# Patient Record
Sex: Male | Born: 1945 | Race: White | Hispanic: No | Marital: Single | Smoking: Former smoker
Health system: Southern US, Community
[De-identification: ages and names within clinical notes are randomized; demographics above are authoritative.]

## PROBLEM LIST (undated history)

## (undated) DIAGNOSIS — I10 Essential (primary) hypertension: Secondary | ICD-10-CM

## (undated) DIAGNOSIS — E119 Type 2 diabetes mellitus without complications: Secondary | ICD-10-CM

## (undated) DIAGNOSIS — K219 Gastro-esophageal reflux disease without esophagitis: Secondary | ICD-10-CM

## (undated) DIAGNOSIS — F1021 Alcohol dependence, in remission: Secondary | ICD-10-CM

## (undated) DIAGNOSIS — M6281 Muscle weakness (generalized): Secondary | ICD-10-CM

## (undated) DIAGNOSIS — N183 Chronic kidney disease, stage 3 unspecified: Secondary | ICD-10-CM

## (undated) DIAGNOSIS — Z978 Presence of other specified devices: Secondary | ICD-10-CM

## (undated) DIAGNOSIS — R627 Adult failure to thrive: Secondary | ICD-10-CM

## (undated) DIAGNOSIS — Z87828 Personal history of other (healed) physical injury and trauma: Secondary | ICD-10-CM

## (undated) DIAGNOSIS — M25331 Other instability, right wrist: Secondary | ICD-10-CM

## (undated) DIAGNOSIS — H9191 Unspecified hearing loss, right ear: Secondary | ICD-10-CM

## (undated) DIAGNOSIS — C679 Malignant neoplasm of bladder, unspecified: Secondary | ICD-10-CM

## (undated) DIAGNOSIS — R2689 Other abnormalities of gait and mobility: Secondary | ICD-10-CM

## (undated) DIAGNOSIS — G629 Polyneuropathy, unspecified: Secondary | ICD-10-CM

## (undated) DIAGNOSIS — D649 Anemia, unspecified: Secondary | ICD-10-CM

## (undated) DIAGNOSIS — N139 Obstructive and reflux uropathy, unspecified: Secondary | ICD-10-CM

## (undated) HISTORY — PX: HAND SURGERY: SHX662

## (undated) HISTORY — PX: LAPAROSCOPIC CHOLECYSTECTOMY: SUR755

---

## 1994-01-24 HISTORY — PX: FRACTURE SURGERY: SHX138

## 2002-10-05 ENCOUNTER — Inpatient Hospital Stay (HOSPITAL_COMMUNITY): Admission: EM | Admit: 2002-10-05 | Discharge: 2002-10-08 | Payer: Self-pay | Admitting: Emergency Medicine

## 2002-10-05 ENCOUNTER — Encounter: Payer: Self-pay | Admitting: Emergency Medicine

## 2002-10-07 ENCOUNTER — Encounter (INDEPENDENT_AMBULATORY_CARE_PROVIDER_SITE_OTHER): Payer: Self-pay | Admitting: Specialist

## 2009-06-05 ENCOUNTER — Ambulatory Visit: Payer: Self-pay | Admitting: Oncology

## 2009-06-08 LAB — CBC & DIFF AND RETIC
Basophils Absolute: 0 10*3/uL (ref 0.0–0.1)
EOS%: 0.2 % (ref 0.0–7.0)
HCT: 54.8 % — ABNORMAL HIGH (ref 38.4–49.9)
HGB: 19.5 g/dL — ABNORMAL HIGH (ref 13.0–17.1)
LYMPH%: 16.4 % (ref 14.0–49.0)
MCH: 33.2 pg (ref 27.2–33.4)
MCHC: 35.6 g/dL (ref 32.0–36.0)
MCV: 93.4 fL (ref 79.3–98.0)
MONO#: 0.9 10*3/uL (ref 0.1–0.9)
NEUT#: 8.1 10*3/uL — ABNORMAL HIGH (ref 1.5–6.5)
lymph#: 1.8 10*3/uL (ref 0.9–3.3)

## 2009-06-08 LAB — COMPREHENSIVE METABOLIC PANEL
Alkaline Phosphatase: 88 U/L (ref 39–117)
BUN: 11 mg/dL (ref 6–23)
CO2: 23 mEq/L (ref 19–32)
Calcium: 9.4 mg/dL (ref 8.4–10.5)
Chloride: 101 mEq/L (ref 96–112)
Creatinine, Ser: 0.91 mg/dL (ref 0.40–1.50)
Glucose, Bld: 193 mg/dL — ABNORMAL HIGH (ref 70–99)
Total Bilirubin: 1.5 mg/dL — ABNORMAL HIGH (ref 0.3–1.2)

## 2009-06-08 LAB — MORPHOLOGY

## 2009-06-08 LAB — CHCC SMEAR

## 2009-06-09 LAB — IRON AND TIBC
%SAT: 27 % (ref 20–55)
Iron: 107 ug/dL (ref 42–165)
TIBC: 394 ug/dL (ref 215–435)

## 2009-06-09 LAB — ERYTHROPOIETIN: Erythropoietin: 3.2 m[IU]/mL (ref 2.6–34.0)

## 2009-06-09 LAB — FERRITIN: Ferritin: 166 ng/mL (ref 22–322)

## 2009-06-17 ENCOUNTER — Ambulatory Visit (HOSPITAL_BASED_OUTPATIENT_CLINIC_OR_DEPARTMENT_OTHER): Admission: RE | Admit: 2009-06-17 | Discharge: 2009-06-17 | Payer: Self-pay | Admitting: Orthopedic Surgery

## 2009-06-26 ENCOUNTER — Ambulatory Visit: Admission: RE | Admit: 2009-06-26 | Discharge: 2009-06-26 | Payer: Self-pay | Admitting: Oncology

## 2009-10-06 ENCOUNTER — Ambulatory Visit: Payer: Self-pay | Admitting: Oncology

## 2010-04-12 LAB — POCT I-STAT, CHEM 8
BUN: 13 mg/dL (ref 6–23)
Calcium, Ion: 1.09 mmol/L — ABNORMAL LOW (ref 1.12–1.32)
Chloride: 95 mEq/L — ABNORMAL LOW (ref 96–112)
Creatinine, Ser: 0.8 mg/dL (ref 0.4–1.5)
Glucose, Bld: 207 mg/dL — ABNORMAL HIGH (ref 70–99)
HCT: 64 % — ABNORMAL HIGH (ref 39.0–52.0)
Hemoglobin: 21.8 g/dL (ref 13.0–17.0)
Potassium: 3.5 mEq/L (ref 3.5–5.1)
Sodium: 133 mEq/L — ABNORMAL LOW (ref 135–145)
TCO2: 29 mmol/L (ref 0–100)

## 2010-04-12 LAB — CARBOXYHEMOGLOBIN
O2 Saturation: 98.2 %
Total hemoglobin: 18.3 g/dL — ABNORMAL HIGH (ref 13.5–18.0)

## 2010-05-06 NOTE — Op Note (Signed)
  NAMEZACARIAS, KRAUTER                  ACCOUNT NO.:  [id number]  MEDICAL RECORD NO.:  [id number]           PATIENT TYPE:  LOCATION:                                 FACILITY:  PHYSICIAN:  Artist Pais. Mina Marble, M.D.   DATE OF BIRTH:  DATE OF PROCEDURE:  04/18/2010 DATE OF DISCHARGE:                              OPERATIVE REPORT   PREOPERATIVE DIAGNOSIS:  Right hand dorsal laceration with exposed tendon soft tissue loss.  POSTOPERATIVE DIAGNOSIS:  Right hand dorsal laceration with exposed tendon soft tissue loss.  PROCEDURE:  I and D above with extensor tendon repair x2 and primary wound closure.  SURGEON:  Artist Pais. Mina Marble, M.D.  ASSISTANT:  None.  ANESTHESIA:  General.  TOURNIQUET TIME:  33 minutes.  No complication.  No Drains.  The patient was taken to the operating suite after the induction of adequate general anesthesia.  The right upper extremity was prepped and draped in sterile fashion.  An Esmarch was used exsanguinate the limb. Tourniquet was then inflated to 250 mmHg at this point in time.  A dorsal wound on the right hand was explored.  There was lacerations to the long and ring extensor tendons.  These were debrided of clot, repaired with Ethibond suture 2-0.  There was also some irregularity of the wound edges, it was carefully advanced and loosely closed with 4-0 nylon suture.  The patient was placed in a sterile dressing of Xeroform, 4x4s, fluffs, and a volar splint.  The patient tolerated the procedure, went to recovery room in stable fashion.     Artist Pais Mina Marble, M.D.     MAW/MEDQ  D:  04/21/2010  T:  04/22/2010  Job:  [id number]  Electronically Signed by Dairl Ponder M.D. on 05/06/2010 03:03:14 PM

## 2010-06-11 NOTE — Op Note (Signed)
   [name], [name] NO.:  [id number]   MEDICAL RECORD NO.:  [id number]                   PATIENT TYPE:  INP   LOCATION:  5734                                 FACILITY:  [facility]   PHYSICIAN:  Lina Sar, M.D. [facility]               DATE OF BIRTH:  [birth year]   DATE OF PROCEDURE:  DATE OF DISCHARGE:  10/08/2002                                 OPERATIVE REPORT   PROCEDURE:  ERCP   PHYSICIAN:  Lina Sar, M.D. [facility]   INDICATION:  This 65 year old white male was admitted with recurrent acute  upper abdominal pain which started on the night of admission.  He had been  drinking a lot of alcohol chronically.  He was found to have cholelithiasis  on gallbladder ultrasound as well as splenomegaly, gallbladder wall was  thickened.  He was treated for cholangitis with Unasyn intravenously and is  undergoing ERCP to rule out common bile duct stone.   PROCEDURE PERFORMED:  Endoscope for general and single chamber scope   SEDATION:  Versed 9 mg IV, Fentanyl 19 mcg IV, Glucagon 0.5 mg IV.   FINDINGS:  The scope was passed through the esophagus into the stomach and  through the pyloric channel into the duodenum.  Papilla was visualized  without difficulty and showed a normal appearance.  The common bile duct  filled as well as part of the main pancreatic duct.  The main pancreatic  duct was intentionally avoided.  The common bile duct was visualized and  shows a 3 mm diameter common bile duct with filling of all hepatic ducts  into hepatic radicals which appears normal.  There were no stones in the  common bile duct and the common bile duct filled rapidly.  Cystic duct was  patent, part of the gallbladder filled showing multiple gall stones.  A  standard sphincterotomy was carried out with sphincterotome at the papilla  with no bleeding.  Fluoroscopy was provided.  The patient tolerated the  procedure well.   IMPRESSION:  1. Cholelithiasis.  2. Normal common  bile duct.  3. Status post sphincterotomy.   PLAN:  1. Observation.  2. Laparoscopic cholecystectomy.  3. Repeat liver function tests.  4. Suggest intraoperative liver biopsy to rule out alcohol related liver     disease.  5. Watch for alcohol withdrawal.  6. Amylase, lipase.                                               Lina Sar, M.D. [facility]    DB/MEDQ  D:  11/28/2002  T:  11/29/2002  Job:  [id number]
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
## 2010-06-11 NOTE — Op Note (Signed)
NAMEMARKEITH, [name] NO.:  [id number]   MEDICAL RECORD NO.:  [id number]                   PATIENT TYPE:  INP   LOCATION:  5734                                 FACILITY:  [facility]   PHYSICIAN:  Lina Sar, M.D. [facility]               DATE OF BIRTH:  [birth year]   DATE OF PROCEDURE:  10/06/2002  DATE OF DISCHARGE:                                 OPERATIVE REPORT   PROCEDURE:  Endoscopic retrograde cholangiopancreatography.   HISTORY OF PRESENT ILLNESS:  This 65 year old white male was admitted with  severe right upper quadrant abdominal pain with bilirubin 4.4 and tenderness  in the right upper quadrant.  The pain subsided after pain medications.  He  gives history of heavy alcohol use.  Ultrasound sound of the upper abdomen  showed cholelithiasis but normal  common bile duct.  His bilirubin today was 9 with direct and indirect  bilirubin split equally.  He has had previous attacks of abdominal pain  suggestive biliary colic.  He has undergoing ERCP to rule out common bile  duct stone.   ENDOSCOPE:  Olympus single-channel side-viewing duodenoscope.   CONSCIOUS SEDATION:  Versed 9 mg IV, fentanyl 90 mcg IV and glucagon 0.5 mg  IV.   FINDINGS:  Olympus single-channel video endoscope passed beneath the  posterior pharynx into the esophagus, through the stomach and into the  duodenum.  Papilla was visualized immediately and showed normal size.  It  was cannulated without difficulty and selectively only common bile duct was  filled.  I have avoided intentionally pancreatic duct because the patient's  amylase has been elevated.  Only a slight amount of dye visualized the  pancreatic duct and the head of the pancreas.  Bile duct filled immediately  and showed very small size, being at most 3 mm in diameter.  It was smooth,  tapered distally.  The flow insertion cystic duct which was patent.  Gallbladder was visualized with multiple stones.  Common hepatic duct  and  intrahepatic radicals appeared normal.  There was no evidence of stones.  Sphincterotomy was carried out over a guide wire under fluoroscopic  guidance.  Some bile exuded but no evidence of stones.  Because of the small  size of the common bile duct, balloon was not placed in the common bile duct  for a sweep.  Fluoroscopic images were reviewed and since no stones were  seen, the procedure was terminated.   The patient tolerated the procedure well.   IMPRESSION:  1. Cholelithiasis.  2. Normal common bile duct without evidence of retained stones.  3. Status post standard sphincterotomy.   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  The patient also has cholelithiasis and  cholecystitis and is scheduled for laparoscopic cholecystectomy and liver  biopsy for  tomorrow, October 07, 2002.  We will  follow his amylase and lipase, keep  him on bowel rest and watch for alcohol withdrawal.  He will have liver  function tests repeated in the morning.  The patient is to continue on  Unasyn 1.5 g IV.                                               Lina Sar, M.D. [facility]    DB/MEDQ  D:  10/06/2002  T:  10/06/2002  Job:  [id number]   cc:   Vikki Ports, M.D.  [address]  [place]  [place] [address]  Fax: [phone]
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
## 2010-06-11 NOTE — Op Note (Signed)
   NAMESADIEL, MOTA NO.:  [id number]   MEDICAL RECORD NO.:  [id number]                   PATIENT TYPE:  INP   LOCATION:  5734                                 FACILITY:  [facility]   PHYSICIAN:  Vikki Ports, M.D.         DATE OF BIRTH:  1945/01/29   DATE OF PROCEDURE:  10/07/2002  DATE OF DISCHARGE:                                 OPERATIVE REPORT   PREOPERATIVE DIAGNOSES:  Elevated liver function tests, normal endoscopic  retrograde cholangiopancreatography and acute cholecystitis.   POSTOPERATIVE DIAGNOSES:  Elevated liver function tests, normal endoscopic  retrograde cholangiopancreatography and acute cholecystitis.   OPERATION PERFORMED:  Laparoscopic cholecystectomy.   SURGEON:  Vikki Ports, M.D.   ASSISTANT:  Adolph Pollack, M.D.   ANESTHESIA:  General.   DESCRIPTION OF PROCEDURE:  The patient was taken to the operating room and  placed in supine position.  After adequate  anesthesia was induced, the  abdomen was prepped and draped in the normal sterile fashion.  Using a  transverse infraumbilical incision, I dissected down to the fascia.  The  fascia was opened vertically.  A 0 Vicryl pursestring suture was placed  around the fascial defect.  The Hasson trocar was placed in the abdomen and  the abdomen was insufflated with continuous flow carbon dioxide.  Under  direct visualization, a 10 mm port was placed in the subxiphoid region, two  5 mm ports were placed in the right abdomen.  The gallbladder was  identified, was very edematous, had a significant amount of adhesions.  These were taken down.  The gallbladder was very, very thick-walled and  ischemic appearing.  The neck of the gallbladder was retracted laterally.  A  good window was created around the cystic duct and it was triply clipped and  divided. The cystic artery was also well-visualized, triply clipped and  divided.  The gallbladder virtually had no  mesentery of the liver and it  peeled off the liver causing significant bleeding from the gallbladder bed.  This was controlled using electrocautery, Surgicel and packing.  After  adequate hemostasis was ensured, the gallbladder was placed in an EndoCatch  bag and removed from the umbilical port.  Adequate hemostasis was ensured.  Infraumbilical fascial defect was closed with the 0 Vicryl pursestring  suture.  Skin incisions were closed with staples.  Incisions were injected  using Marcaine.  The patient tolerated the procedure well and went to PACU  in good condition.                                                Vikki Ports, M.D.    KRH/MEDQ  D:  10/07/2002  T:  10/07/2002  Job:  [id number]

## 2010-06-11 NOTE — Discharge Summary (Signed)
[name], [name]                             ACCOUNT NO.:  [id number]   MEDICAL RECORD NO.:  [id number]                   PATIENT TYPE:  INP   LOCATION:  5734                                 FACILITY:  [facility]   PHYSICIAN:  Vikki Ports, M.D.         DATE OF BIRTH:  1946/01/15   DATE OF ADMISSION:  10/05/2002  DATE OF DISCHARGE:  10/08/2002                                 DISCHARGE SUMMARY   ADMISSION DIAGNOSIS:  Choledocholithiasis.   DISCHARGE DIAGNOSIS:  Choledocholithiasis.   CONDITION ON DISCHARGE:  Good and improved.   CONSULTING PHYSICIAN:  Dr. Lina Sar, M.D. [facility]   DISPOSITION:  Discharged to home.   MEDICATIONS:  Vicodin one to two p.o. q.4h. p.r.n. pain.   FOLLOWUP:  With me one week after discharge.   BRIEF HISTORY AND PHYSICAL:  The patient is a 65 year old white male who  presented to the emergency room with abdominal pain, fever, increased liver  function tests and a bilirubin of 4.7.  An ultrasound was consistent with  stones in the gallbladder and probable choledocholithiasis.  For the  remainder of the H&P, please see the chart.   HOSPITAL COURSE:  The patient was admitted by gastroenterology and underwent  ERCP.  The patient had a very small common bile duct and had a  sphincterotomy performed, but no stones were seen.  On the second hospital  day, the patient was taken to the operating room and underwent laparoscopic  cholecystectomy and liver biopsy.  Postoperatively, the patient did quite  well and was able to tolerated p.o. intake by postoperative day number 1.   FOLLOWUP:  1. With me.  2. With gastroenterologist pending hepatitis serologies.                                                Vikki Ports, M.D.    KRH/MEDQ  D:  02/12/2003  T:  02/12/2003  Job:  [id number]

## 2010-08-02 ENCOUNTER — Other Ambulatory Visit: Payer: Self-pay | Admitting: Neurology

## 2010-08-02 DIAGNOSIS — H532 Diplopia: Secondary | ICD-10-CM

## 2010-08-02 DIAGNOSIS — R269 Unspecified abnormalities of gait and mobility: Secondary | ICD-10-CM

## 2010-08-06 ENCOUNTER — Inpatient Hospital Stay: Admission: RE | Admit: 2010-08-06 | Payer: Self-pay | Source: Ambulatory Visit

## 2010-08-06 ENCOUNTER — Other Ambulatory Visit: Payer: Self-pay

## 2013-10-11 ENCOUNTER — Other Ambulatory Visit: Payer: Self-pay | Admitting: *Deleted

## 2013-10-11 NOTE — Telephone Encounter (Signed)
Received fax to refill HCTZ- sent fax to pharmacy pt is not seen in our clinic.

## 2015-05-30 ENCOUNTER — Emergency Department (HOSPITAL_COMMUNITY)
Admission: EM | Admit: 2015-05-30 | Discharge: 2015-05-30 | Disposition: A | Discharge status: Home / Self Care | Payer: Medicare Other | Attending: Emergency Medicine | Admitting: Emergency Medicine

## 2015-05-30 ENCOUNTER — Encounter (HOSPITAL_COMMUNITY): Payer: Self-pay | Admitting: Emergency Medicine

## 2015-05-30 DIAGNOSIS — Y92096 Garden or yard of other non-institutional residence as the place of occurrence of the external cause: Secondary | ICD-10-CM | POA: Diagnosis not present

## 2015-05-30 DIAGNOSIS — Y999 Unspecified external cause status: Secondary | ICD-10-CM | POA: Insufficient documentation

## 2015-05-30 DIAGNOSIS — I10 Essential (primary) hypertension: Secondary | ICD-10-CM | POA: Diagnosis not present

## 2015-05-30 DIAGNOSIS — W19XXXA Unspecified fall, initial encounter: Secondary | ICD-10-CM | POA: Insufficient documentation

## 2015-05-30 DIAGNOSIS — Z79899 Other long term (current) drug therapy: Secondary | ICD-10-CM | POA: Diagnosis not present

## 2015-05-30 DIAGNOSIS — S51811A Laceration without foreign body of right forearm, initial encounter: Secondary | ICD-10-CM | POA: Insufficient documentation

## 2015-05-30 DIAGNOSIS — S51819A Laceration without foreign body of unspecified forearm, initial encounter: Secondary | ICD-10-CM

## 2015-05-30 DIAGNOSIS — Y93H9 Activity, other involving exterior property and land maintenance, building and construction: Secondary | ICD-10-CM | POA: Diagnosis not present

## 2015-05-30 DIAGNOSIS — Z7982 Long term (current) use of aspirin: Secondary | ICD-10-CM | POA: Insufficient documentation

## 2015-05-30 DIAGNOSIS — IMO0002 Reserved for concepts with insufficient information to code with codable children: Secondary | ICD-10-CM

## 2015-05-30 HISTORY — DX: Essential (primary) hypertension: I10

## 2015-05-30 MED ORDER — LIDOCAINE-EPINEPHRINE (PF) 2 %-1:200000 IJ SOLN
20.0000 mL | Freq: Once | INTRAMUSCULAR | Status: AC
  Administered 2015-05-30: 20 mL
  Filled 2015-05-30: qty 20

## 2015-05-30 MED ORDER — LIDOCAINE-EPINEPHRINE-TETRACAINE (LET) SOLUTION
3.0000 mL | Freq: Once | NASAL | Status: AC
  Administered 2015-05-30: 3 mL via TOPICAL
  Filled 2015-05-30: qty 3

## 2015-05-30 NOTE — ED Notes (Signed)
Bed: [id number] Expected date:  Expected time:  Means of arrival:  Comments: Hold for triage 1

## 2015-05-30 NOTE — Discharge Instructions (Signed)
Laceration Care, Adult  A laceration is a cut that goes through all layers of the skin. The cut also goes into the tissue that is right under the skin. Some cuts heal on their own. Others need to be closed with stitches (sutures), staples, skin adhesive strips, or wound glue. Taking care of your cut lowers your risk of infection and helps your cut to heal better.  HOW TO TAKE CARE OF YOUR CUT  For stitches or staples:  · Keep the wound clean and dry.  · If you were given a bandage (dressing), you should change it at least one time per day or as told by your doctor. You should also change it if it gets wet or dirty.  · Keep the wound completely dry for the first 24 hours or as told by your doctor. After that time, you may take a shower or a bath. However, make sure that the wound is not soaked in water until after the stitches or staples have been removed.  · Clean the wound one time each day or as told by your doctor:    Wash the wound with soap and water.    Rinse the wound with water until all of the soap comes off.    Pat the wound dry with a clean towel. Do not rub the wound.  · After you clean the wound, put a thin layer of antibiotic ointment on it as told by your doctor. This ointment:    Helps to prevent infection.    Keeps the bandage from sticking to the wound.  · Have your stitches or staples removed as told by your doctor.  If your doctor used skin adhesive strips:   · Keep the wound clean and dry.  · If you were given a bandage, you should change it at least one time per day or as told by your doctor. You should also change it if it gets dirty or wet.  · Do not get the skin adhesive strips wet. You can take a shower or a bath, but be careful to keep the wound dry.  · If the wound gets wet, pat it dry with a clean towel. Do not rub the wound.  · Skin adhesive strips fall off on their own. You can trim the strips as the wound heals. Do not remove any strips that are still stuck to the wound. They will  fall off after a while.  If your doctor used wound glue:  · Try to keep your wound dry, but you may briefly wet it in the shower or bath. Do not soak the wound in water, such as by swimming.  · After you take a shower or a bath, gently pat the wound dry with a clean towel. Do not rub the wound.  · Do not do any activities that will make you really sweaty until the skin glue has fallen off on its own.  · Do not apply liquid, cream, or ointment medicine to your wound while the skin glue is still on.  · If you were given a bandage, you should change it at least one time per day or as told by your doctor. You should also change it if it gets dirty or wet.  · If a bandage is placed over the wound, do not let the tape for the bandage touch the skin glue.  · Do not pick at the glue. The skin glue usually stays on for 5-10 days. Then, it   or when wound glue stays in place and the wound is healed. Make sure to wear a sunscreen of at least 30 SPF.  Take over-the-counter and prescription medicines only as told by your doctor.  If you were given antibiotic medicine or ointment, take or apply it as told by your doctor. Do not stop using the antibiotic even if your wound is getting better.  Do not scratch or pick at the wound.  Keep all follow-up visits as told by your doctor. This is important.  Check your wound every day for signs of infection. Watch for:  Redness, swelling, or pain.  Fluid, blood, or pus.  Raise (elevate) the injured area above the level of your heart while you are sitting or lying down, if possible. GET HELP IF:  You got a tetanus shot and you have any of these problems at the injection site:  Swelling.  Very bad pain.  Redness.  Bleeding.  You have a fever.  A wound that was  closed breaks open.  You notice a bad smell coming from your wound or your bandage.  You notice something coming out of the wound, such as wood or glass.  Medicine does not help your pain.  You have more redness, swelling, or pain at the site of your wound.  You have fluid, blood, or pus coming from your wound.  You notice a change in the color of your skin near your wound.  You need to change the bandage often because fluid, blood, or pus is coming from the wound.  You start to have a new rash.  You start to have numbness around the wound. GET HELP RIGHT AWAY IF:  You have very bad swelling around the wound.  Your pain suddenly gets worse and is very bad.  You notice painful lumps near the wound or on skin that is anywhere on your body.  You have a red streak going away from your wound.  The wound is on your hand or foot and you cannot move a finger or toe like you usually can.  The wound is on your hand or foot and you notice that your fingers or toes look pale or bluish.   This information is not intended to replace advice given to you by your health care provider. Make sure you discuss any questions you have with your health care provider.   Return to the ED in 1 week for suture removal. Keep wound clean and dry. May wash with soap and water. Apply antibiotic ointment. Be careful as your skin is thin and the stitches may tear. Return to the ED sooner if you experience redness or swelling around your wound, fevers, chills.

## 2015-05-30 NOTE — ED Notes (Signed)
Wounds dressed with bacitracin and a gauze dressing.

## 2015-05-30 NOTE — ED Provider Notes (Signed)
CSN: [id number]     Arrival date & time 05/30/15  1343 History   First MD Initiated Contact with Patient 05/30/15 1418     Chief Complaint  Patient presents with  . Extremity Laceration     (Consider location/radiation/quality/duration/timing/severity/associated sxs/prior Treatment) HPI  [name] is a  70 year old male who presents the ED complaining of a laceration to his right forearm. He states that he was working on a fence when he fell backwards and tried to catch himself. However the physician's raked his skin of his forearm and some on his left forearm. He denies head injury or LOC. No other trauma or injury noted. He denies any paresthesias or weakness. His last tetanus was 5 years ago. He does not take any blood thinners.  Past Medical History  Diagnosis Date  . Hypertension    History reviewed. No pertinent past surgical history. No family history on file. Social History  Substance Use Topics  . Smoking status: Never Smoker   . Smokeless tobacco: None  . Alcohol Use: No    Review of Systems  All other systems reviewed and are negative.     Allergies  Review of patient's allergies indicates no known allergies.  Home Medications   Prior to Admission medications   Medication Sig Start Date End Date Taking? Authorizing Provider  aspirin 81 MG tablet Take 81 mg by mouth daily.   Yes Historical Provider, MD  cetirizine (ZYRTEC) 10 MG tablet Take 10 mg by mouth daily.    Yes Historical Provider, MD  citalopram (CELEXA) 20 MG tablet Take 20 mg by mouth daily.  04/29/15  Yes Historical Provider, MD  lisinopril-hydrochlorothiazide (PRINZIDE,ZESTORETIC) 10-12.5 MG tablet Take 1 tablet by mouth daily.  04/29/15  Yes Historical Provider, MD   BP 161/82 mmHg  Pulse 99  Temp(Src) 98.2 F (36.8 C) (Oral)  Resp 18  SpO2 100% Physical Exam  Constitutional: He is oriented to person, place, and time. He appears well-developed and well-nourished. No distress.  HENT:  Head:  Normocephalic and atraumatic.  Eyes: Conjunctivae are normal. Right eye exhibits no discharge. Left eye exhibits no discharge. No scleral icterus.  Cardiovascular: Normal rate.   Pulmonary/Chest: Effort normal.  Musculoskeletal:  No decreased range of motion of right wrist. No pain with flexion or extension. No evidence of tendon injury. Intact distal pulses. Negative Allen's test.  Neurological: He is alert and oriented to person, place, and time. Coordination normal.  Skin: Skin is warm and dry. No rash noted. He is not diaphoretic. No erythema. No pallor.  5 cm horizontal laceration to dorsal aspect distal right forearm. Associated superficial skin tear on left forearm. 1 cm puncture wound to palmar aspect of right hand.   Psychiatric: He has a normal mood and affect. His behavior is normal.  Nursing note and vitals reviewed.   ED Course  Procedures (including critical care time)  LACERATION REPAIR Performed by: Carlos Levering Authorized by: Carlos Levering Consent: Verbal consent obtained. Risks and benefits: risks, benefits and alternatives were discussed Consent given by: patient Patient identity confirmed: provided demographic data Prepped and Draped in normal sterile fashion Wound explored  Laceration Location: R forearm and R palm of hand  Laceration Length: 5cm  No Foreign Bodies seen or palpated  Anesthesia: local infiltration  Local anesthetic: lidocaine 2% with epinephrine  Anesthetic total: 5 ml  Irrigation method: syringe Amount of cleaning: standard  Skin closure: approximated  Number of sutures: 9  Technique: simple interrupted  Patient tolerance: Patient tolerated the procedure well with no immediate complications.  Labs Review Labs Reviewed - No data to display  Imaging Review No results found. I have personally reviewed and evaluated these images and lab results as part of my medical decision-making.   EKG  Interpretation None      MDM   Final diagnoses:  Laceration  Skin tear of forearm without complication, unspecified laterality, initial encounter    Tdap UTD .Pressure irrigation performed. Laceration occurred < 8 hours prior to repair which was well tolerated. Pt has no co morbidities to effect normal wound healing. Discussed suture home care w pt and answered questions. Pt to f-u for wound check and suture removal in 7 days. Pt is hemodynamically stable w no complaints prior to dc.       Dondra Spry [place], PA-C 05/30/15 2130  Lacretia Leigh, MD 06/07/15 [phone]

## 2015-05-30 NOTE — ED Notes (Signed)
Per pt, states he was doing yard work-fell and cut right forearm-skin tear on left arm

## 2018-11-20 ENCOUNTER — Emergency Department (HOSPITAL_COMMUNITY)
Admission: EM | Admit: 2018-11-20 | Discharge: 2018-11-20 | Disposition: A | Discharge status: Home / Self Care | Payer: Medicare Other | Attending: Emergency Medicine | Admitting: Emergency Medicine

## 2018-11-20 ENCOUNTER — Other Ambulatory Visit: Payer: Self-pay

## 2018-11-20 ENCOUNTER — Encounter (HOSPITAL_COMMUNITY): Payer: Self-pay | Admitting: Emergency Medicine

## 2018-11-20 ENCOUNTER — Emergency Department (HOSPITAL_COMMUNITY): Payer: Medicare Other

## 2018-11-20 DIAGNOSIS — R319 Hematuria, unspecified: Secondary | ICD-10-CM | POA: Diagnosis present

## 2018-11-20 DIAGNOSIS — I10 Essential (primary) hypertension: Secondary | ICD-10-CM | POA: Diagnosis not present

## 2018-11-20 DIAGNOSIS — N3289 Other specified disorders of bladder: Secondary | ICD-10-CM | POA: Insufficient documentation

## 2018-11-20 DIAGNOSIS — Z7982 Long term (current) use of aspirin: Secondary | ICD-10-CM | POA: Insufficient documentation

## 2018-11-20 DIAGNOSIS — Z79899 Other long term (current) drug therapy: Secondary | ICD-10-CM | POA: Insufficient documentation

## 2018-11-20 LAB — CBC WITH DIFFERENTIAL/PLATELET
Abs Immature Granulocytes: 0.08 10*3/uL — ABNORMAL HIGH (ref 0.00–0.07)
Basophils Absolute: 0.1 10*3/uL (ref 0.0–0.1)
Basophils Relative: 1 %
Eosinophils Absolute: 0.1 10*3/uL (ref 0.0–0.5)
Eosinophils Relative: 1 %
HCT: 47.7 % (ref 39.0–52.0)
Hemoglobin: 16.2 g/dL (ref 13.0–17.0)
Immature Granulocytes: 1 %
Lymphocytes Relative: 15 %
Lymphs Abs: 1.7 10*3/uL (ref 0.7–4.0)
MCH: 35.4 pg — ABNORMAL HIGH (ref 26.0–34.0)
MCHC: 34 g/dL (ref 30.0–36.0)
MCV: 104.1 fL — ABNORMAL HIGH (ref 80.0–100.0)
Monocytes Absolute: 0.8 10*3/uL (ref 0.1–1.0)
Monocytes Relative: 7 %
Neutro Abs: 8.6 10*3/uL — ABNORMAL HIGH (ref 1.7–7.7)
Neutrophils Relative %: 75 %
Platelets: 250 10*3/uL (ref 150–400)
RBC: 4.58 MIL/uL (ref 4.22–5.81)
RDW: 13.3 % (ref 11.5–15.5)
WBC: 11.3 10*3/uL — ABNORMAL HIGH (ref 4.0–10.5)
nRBC: 0 % (ref 0.0–0.2)

## 2018-11-20 LAB — BASIC METABOLIC PANEL
Anion gap: 15 (ref 5–15)
BUN: 12 mg/dL (ref 8–23)
CO2: 27 mmol/L (ref 22–32)
Calcium: 9.3 mg/dL (ref 8.9–10.3)
Chloride: 92 mmol/L — ABNORMAL LOW (ref 98–111)
Creatinine, Ser: 1.15 mg/dL (ref 0.61–1.24)
GFR calc Af Amer: >60 mL/min (ref >60)
GFR calc non Af Amer: >60 mL/min (ref >60)
Glucose, Bld: 173 mg/dL — ABNORMAL HIGH (ref 70–99)
Potassium: 3 mmol/L — ABNORMAL LOW (ref 3.5–5.1)
Sodium: 134 mmol/L — ABNORMAL LOW (ref 135–145)

## 2018-11-20 LAB — URINALYSIS, ROUTINE W REFLEX MICROSCOPIC: RBC / HPF: >50 RBC/hpf — ABNORMAL HIGH (ref 0–5)

## 2018-11-20 MED ORDER — SODIUM CHLORIDE (PF) 0.9 % IJ SOLN
INTRAMUSCULAR | Status: AC
  Filled 2018-11-20: qty 50

## 2018-11-20 MED ORDER — IOHEXOL 300 MG/ML  SOLN
100.0000 mL | Freq: Once | INTRAMUSCULAR | Status: AC | PRN
  Administered 2018-11-20: 14:00:00 100 mL via INTRAVENOUS

## 2018-11-20 NOTE — ED Provider Notes (Addendum)
[place] DEPT Provider Note   CSN: [id number] Arrival date & time: 11/20/18  1034     History   Chief Complaint Chief Complaint  Patient presents with   Dysuria   Hematuria    HPI [name]. is a 73 y.o. male.  Presents with dysuria and hematuria.  Patient went to his primary doctor yesterday with complaint and was recommended to come to ER for further management.  Patient states noted both hematuria and dysuria on Saturday.  Sunday states he had an episode of difficulty urinating, states he passed a couple small blood clots and then had no issues with urination.  He states has continued to have hematuria since that time.  Denies any abdominal pain.  No nausea vomiting, fevers.  States around 1 year ago he had similar episode that lasted for couple days and resolve spontaneously.  He was referred to urology, but never followed up with them in their clinic.  Patient's past medical history hypertension, diabetes.  Denies prior history of smoking, cancer.     HPI  Past Medical History:  Diagnosis Date   Hypertension     There are no active problems to display for this patient.   History reviewed. No pertinent surgical history.      Home Medications    Prior to Admission medications   Medication Sig Start Date End Date Taking? Authorizing Provider  aspirin 81 MG tablet Take 81 mg by mouth daily.   Yes [provider]  citalopram (CELEXA) 20 MG tablet Take 20 mg by mouth daily.  04/29/15  Yes [provider]  lisinopril-hydrochlorothiazide (PRINZIDE,ZESTORETIC) 10-12.5 MG tablet Take 1 tablet by mouth daily.  04/29/15  Yes [provider]  metFORMIN (GLUCOPHAGE) 500 MG tablet Take 500 mg by mouth daily with breakfast.   Yes [provider]    Family History No family history on file.  Social History Social History   Tobacco Use   Smoking status: Never Smoker  Substance Use Topics    Alcohol use: No   Drug use: Not on file     Allergies   Patient has no known allergies.   Review of Systems Review of Systems  Constitutional: Negative for chills and fever.  HENT: Negative for ear pain and sore throat.   Eyes: Negative for pain and visual disturbance.  Respiratory: Negative for cough and shortness of breath.   Cardiovascular: Negative for chest pain and palpitations.  Gastrointestinal: Negative for abdominal pain and vomiting.  Genitourinary: Positive for dysuria and hematuria.  Musculoskeletal: Negative for arthralgias and back pain.  Skin: Negative for color change and rash.  Neurological: Negative for seizures and syncope.  All other systems reviewed and are negative.    Physical Exam Updated Vital Signs BP (!) 148/77    Pulse (!) 112    Temp 98.8 F (37.1 C) (Oral)    Resp 16    SpO2 100%   Physical Exam Vitals signs and nursing note reviewed.  Constitutional:      Appearance: He is well-developed.  HENT:     Head: Normocephalic and atraumatic.     Mouth/Throat:     Mouth: Mucous membranes are moist.     Pharynx: Oropharynx is clear.  Eyes:     Conjunctiva/sclera: Conjunctivae normal.  Neck:     Musculoskeletal: Neck supple.  Cardiovascular:     Rate and Rhythm: Normal rate and regular rhythm.     Heart sounds: No murmur.  Pulmonary:  Effort: Pulmonary effort is normal. No respiratory distress.     Breath sounds: Normal breath sounds.  Abdominal:     Palpations: Abdomen is soft.     Tenderness: There is no abdominal tenderness.  Genitourinary:    Comments: No suprapubic TTP, no flank TTP Musculoskeletal:        General: No swelling or tenderness.  Skin:    General: Skin is warm and dry.  Neurological:     General: No focal deficit present.     Mental Status: He is alert and oriented to person, place, and time.  Psychiatric:        Mood and Affect: Mood normal.        Behavior: Behavior normal.      ED Treatments / Results    Labs (all labs ordered are listed, but only abnormal results are displayed) Labs Reviewed  URINALYSIS, ROUTINE W REFLEX MICROSCOPIC - Abnormal; Notable for the following components:      Result Value   Color, Urine RED (*)    APPearance TURBID (*)    Glucose, UA   (*)    Value: TEST NOT REPORTED DUE TO COLOR INTERFERENCE OF URINE PIGMENT   Hgb urine dipstick   (*)    Value: TEST NOT REPORTED DUE TO COLOR INTERFERENCE OF URINE PIGMENT   Bilirubin Urine   (*)    Value: TEST NOT REPORTED DUE TO COLOR INTERFERENCE OF URINE PIGMENT   Ketones, ur   (*)    Value: TEST NOT REPORTED DUE TO COLOR INTERFERENCE OF URINE PIGMENT   Protein, ur   (*)    Value: TEST NOT REPORTED DUE TO COLOR INTERFERENCE OF URINE PIGMENT   Nitrite   (*)    Value: TEST NOT REPORTED DUE TO COLOR INTERFERENCE OF URINE PIGMENT   Leukocytes,Ua   (*)    Value: TEST NOT REPORTED DUE TO COLOR INTERFERENCE OF URINE PIGMENT   RBC / HPF >50 (*)    Bacteria, UA RARE (*)    All other components within normal limits  CBC WITH DIFFERENTIAL/PLATELET - Abnormal; Notable for the following components:   WBC 11.3 (*)    MCV 104.1 (*)    MCH 35.4 (*)    Neutro Abs 8.6 (*)    Abs Immature Granulocytes 0.08 (*)    All other components within normal limits  BASIC METABOLIC PANEL - Abnormal; Notable for the following components:   Sodium 134 (*)    Potassium 3.0 (*)    Chloride 92 (*)    Glucose, Bld 173 (*)    All other components within normal limits    EKG None  Radiology Ct Abdomen Pelvis W Contrast  Result Date: 11/20/2018 CLINICAL DATA:  Flank pain, hematuria EXAM: CT ABDOMEN AND PELVIS WITH CONTRAST TECHNIQUE: Multidetector CT imaging of the abdomen and pelvis was performed using the standard protocol following bolus administration of intravenous contrast. CONTRAST:  154mL OMNIPAQUE IOHEXOL 300 MG/ML  SOLN COMPARISON:  None. FINDINGS: Lower chest: The visualized heart size within normal limits. No pericardial  fluid/thickening. There is a small hiatal hernia. The visualized portions of the lungs are clear. Hepatobiliary: The liver is normal in density without focal abnormality.The main portal vein is patent. The patient is status post cholecystectomy. No biliary ductal dilation. Pancreas: Unremarkable. No pancreatic ductal dilatation or surrounding inflammatory changes. Spleen: Normal in size without focal abnormality. Adrenals/Urinary Tract: Both adrenal glands appear normal. Mild bilateral renal atrophy is seen. There is left renal vascular calcification seen  adjacent to the midpole. There are dense vascular calcifications seen at the origin of the bilateral renal arteries. There is a large amount of heterogeneous blood products seen within the bladder with heterogeneous soft tissue mass/thickening along the posterior bladder wall. There is diffuse bladder wall thickening seen around the remainder of the bladder. Stomach/Bowel: The stomach, small bowel, and colon are normal in appearance. No inflammatory changes, wall thickening, or obstructive findings.The appendix is normal. Vascular/Lymphatic: There are no enlarged mesenteric, retroperitoneal, or pelvic lymph nodes. Scattered aortic atherosclerotic calcifications are seen without aneurysmal dilatation. Reproductive: The prostate is heterogeneous and abuts the posterior surface of the bladder. Other: No evidence of abdominal wall mass or hernia. Musculoskeletal: No acute or significant osseous findings. IMPRESSION: Layering blood products with a diffuse heterogeneous soft tissue mass in the posterior bladder with diffuse bladder wall thickening. Aortic Atherosclerosis (ICD10-I70.0). These results were called by telephone at the time of interpretation on 11/20/2018 at 2:09 pm to provider [facility] , who verbally acknowledged these results. Electronically Signed   By: Prudencio Pair M.D.   On: 11/20/2018 14:17    Procedures Procedures (including critical care  time)  Medications Ordered in ED Medications  sodium chloride (PF) 0.9 % injection (has no administration in time range)  iohexol (OMNIPAQUE) 300 MG/ML solution 100 mL (100 mLs Intravenous Contrast Given 11/20/18 1346)     Initial Impression / Assessment and Plan / ED Course  I have reviewed the triage vital signs and the nursing notes.  Pertinent labs & imaging results that were available during my care of the patient were reviewed by me and considered in my medical decision making (see chart for details).  Clinical Course as of Nov 20 1631  Tue Nov 20, 2018  1326 Urinalysis, Routine w reflex microscopic- may I&O cath if menses(!) [RD]  [id number] Basic metabolic panel(!) [RD]  [id number] CBC with Differential(!) [RD]    Clinical Course User Index [RD] Lucrezia Starch, MD       73 year old male presents to ER with hematuria.  Patient well-appearing, labs stable.  CT scan concerning for new bladder mass, high suspicion for malignancy.  Discussed case with urology.  Dr.Bell recommended close follow-up in their clinic, patient will need cystoscopy for further evaluation.  Patient voiding spontaneously at this time without difficulty.  Reviewed return precautions with patient, believe he is appropriate for discharge and outpatient management this time.  After the discussed management above, the patient was determined to be safe for discharge.  The patient was in agreement with this plan and all questions regarding their care were answered.  ED return precautions were discussed and the patient will return to the ED with any significant worsening of condition.    Final Clinical Impressions(s) / ED Diagnoses   Final diagnoses:  Hematuria, unspecified type  Bladder mass    ED Discharge Orders    None       Lucrezia Starch, MD 11/20/18 1634    Lucrezia Starch, MD 11/28/18 0003    Lucrezia Starch, MD 11/28/18 0004

## 2018-11-20 NOTE — ED Triage Notes (Signed)
Pt reports will have blood clots in urine and then sometimes have darker urine and dysuria since Saturday. Denies taking blood thinners.

## 2018-11-20 NOTE — Discharge Instructions (Signed)
Please call the urology office to have your appointment scheduled.  You will need follow-up with them regarding the mass in your bladder and need for additional diagnostic work-up.  If you have any issues with urinary retention, worsening bleeding, abdominal discomfort, vomiting or other new concerning symptom recommend return here for recheck.

## 2018-11-27 ENCOUNTER — Other Ambulatory Visit: Payer: Self-pay | Admitting: Urology

## 2018-12-04 ENCOUNTER — Other Ambulatory Visit (HOSPITAL_COMMUNITY)
Admission: RE | Admit: 2018-12-04 | Discharge: 2018-12-04 | Disposition: A | Discharge status: Home / Self Care | Payer: Medicare Other | Source: Ambulatory Visit | Attending: Urology | Admitting: Urology

## 2018-12-04 DIAGNOSIS — Z01812 Encounter for preprocedural laboratory examination: Secondary | ICD-10-CM | POA: Diagnosis present

## 2018-12-04 DIAGNOSIS — Z20828 Contact with and (suspected) exposure to other viral communicable diseases: Secondary | ICD-10-CM | POA: Diagnosis not present

## 2018-12-04 NOTE — Patient Instructions (Addendum)
DUE TO COVID-19 ONLY ONE VISITOR IS ALLOWED TO COME WITH YOU AND STAY IN THE WAITING ROOM ONLY DURING PRE OP AND PROCEDURE DAY OF SURGERY. THE 1 VISITOR MAY VISIT WITH YOU AFTER SURGERY IN YOUR PRIVATE ROOM DURING VISITING HOURS ONLY!    ONCE YOUR COVID TEST IS COMPLETED, PLEASE BEGIN THE QUARANTINE INSTRUCTIONS AS OUTLINED IN YOUR HANDOUT.                [place].    Your procedure is scheduled on: 12/07/18   Report to [facility] Main  Entrance   Report to admitting at  1:00 PM      Call this number if you have problems the morning of surgery [phone]    Remember: Do not eat food or drink liquids :After Midnight.   BRUSH YOUR TEETH MORNING OF SURGERY AND RINSE YOUR MOUTH OUT, NO CHEWING GUM CANDY OR MINTS.     Take these medicines the morning of surgery with A SIP OF WATER: Citalopram  DO NOT TAKE ANY DIABETIC MEDICATIONS DAY OF YOUR SURGERY    You may not have any metal on your body including piercings              Do not wear jewelry,  lotions, powders or deodorant            .              Men may shave face and neck.   Do not bring valuables to the hospital. [place].  Contacts, dentures or bridgework may not be worn into surgery.      Patients discharged the day of surgery will not be allowed to drive home.   IF YOU ARE HAVING SURGERY AND GOING HOME THE SAME DAY, YOU MUST HAVE AN ADULT TO DRIVE YOU HOME AND BE WITH YOU FOR 24 HOURS  . YOU MAY GO HOME BY TAXI OR UBER OR ORTHERWISE, BUT AN ADULT MUST ACCOMPANY YOU HOME AND STAY WITH YOU FOR 24 HOURS.  Name and phone number of your driver:  Special Instructions: N/A              Please read over the following fact sheets you were given: _____________________________________________________________________             [facility] - Preparing for Surgery  Before surgery, you can play an important role .  Because skin is not sterile, your  skin needs to be as free of germs as possible.   You can reduce the number of germs on your skin by washing with CHG (chlorahexidine gluconate) soap before surgery.   CHG is an antiseptic cleaner which kills germs and bonds with the skin to continue killing germs even after washing. Please DO NOT use if you have an allergy to CHG or antibacterial soaps .  If your skin becomes reddened/irritated stop using the CHG and inform your nurse when you arrive at Short Stay. .  You may shave your face/neck. Please follow these instructions carefully:  1.  Shower with CHG Soap the night before surgery and the  morning of Surgery.  2.  If you choose to wash your hair, wash your hair first as usual with your  normal  shampoo.  3.  After you shampoo, rinse your hair and body thoroughly to remove the  shampoo.  4.  Use CHG as you would any other liquid soap.  You can apply chg directly  to the skin and wash                       Gently with a scrungie or clean washcloth.  5.  Apply the CHG Soap to your body ONLY FROM THE NECK DOWN.   Do not use on face/ open                           Wound or open sores. Avoid contact with eyes, ears mouth and genitals (private parts).                       Wash face,  Genitals (private parts) with your normal soap.             6.  Wash thoroughly, paying special attention to the area where your surgery  will be performed.  7.  Thoroughly rinse your body with warm water from the neck down.  8.  DO NOT shower/wash with your normal soap after using and rinsing off  the CHG Soap.             9.  Pat yourself dry with a clean towel.            10.  Wear clean pajamas.            11.  Place clean sheets on your bed the night of your first shower and do not  sleep with pets. Day of Surgery : Do not apply any lotions/deodorants the morning of surgery.  Please wear clean clothes to the hospital/surgery center.  FAILURE TO FOLLOW THESE  INSTRUCTIONS MAY RESULT IN THE CANCELLATION OF YOUR SURGERY PATIENT SIGNATURE_________________________________  NURSE SIGNATURE__________________________________  ________________________________________________________________________

## 2018-12-06 ENCOUNTER — Encounter (HOSPITAL_COMMUNITY): Payer: Self-pay

## 2018-12-06 ENCOUNTER — Encounter (HOSPITAL_COMMUNITY)
Admission: RE | Admit: 2018-12-06 | Discharge: 2018-12-06 | Disposition: A | Discharge status: Home / Self Care | Payer: Medicare Other | Source: Ambulatory Visit | Attending: Urology | Admitting: Urology

## 2018-12-06 ENCOUNTER — Other Ambulatory Visit: Payer: Self-pay

## 2018-12-06 DIAGNOSIS — N329 Bladder disorder, unspecified: Secondary | ICD-10-CM | POA: Diagnosis not present

## 2018-12-06 DIAGNOSIS — R310 Gross hematuria: Secondary | ICD-10-CM | POA: Insufficient documentation

## 2018-12-06 DIAGNOSIS — Z01818 Encounter for other preprocedural examination: Secondary | ICD-10-CM | POA: Insufficient documentation

## 2018-12-06 LAB — BASIC METABOLIC PANEL
Anion gap: 10 (ref 5–15)
BUN: 21 mg/dL (ref 8–23)
CO2: 28 mmol/L (ref 22–32)
Calcium: 8.8 mg/dL — ABNORMAL LOW (ref 8.9–10.3)
Chloride: 96 mmol/L — ABNORMAL LOW (ref 98–111)
Creatinine, Ser: 1.47 mg/dL — ABNORMAL HIGH (ref 0.61–1.24)
GFR calc Af Amer: 53 mL/min — ABNORMAL LOW (ref >60)
GFR calc non Af Amer: 46 mL/min — ABNORMAL LOW (ref >60)
Glucose, Bld: 142 mg/dL — ABNORMAL HIGH (ref 70–99)
Potassium: 3.2 mmol/L — ABNORMAL LOW (ref 3.5–5.1)
Sodium: 134 mmol/L — ABNORMAL LOW (ref 135–145)

## 2018-12-06 LAB — CBC
HCT: 32.5 % — ABNORMAL LOW (ref 39.0–52.0)
Hemoglobin: 10.7 g/dL — ABNORMAL LOW (ref 13.0–17.0)
MCH: 34.1 pg — ABNORMAL HIGH (ref 26.0–34.0)
MCHC: 32.9 g/dL (ref 30.0–36.0)
MCV: 103.5 fL — ABNORMAL HIGH (ref 80.0–100.0)
Platelets: 264 10*3/uL (ref 150–400)
RBC: 3.14 MIL/uL — ABNORMAL LOW (ref 4.22–5.81)
RDW: 13.4 % (ref 11.5–15.5)
WBC: 9.6 10*3/uL (ref 4.0–10.5)
nRBC: 0 % (ref 0.0–0.2)

## 2018-12-06 LAB — NOVEL CORONAVIRUS, NAA (HOSP ORDER, SEND-OUT TO REF LAB; TAT 18-24 HRS): SARS-CoV-2, NAA: NOT DETECTED

## 2018-12-06 LAB — HEMOGLOBIN A1C
Hgb A1c MFr Bld: 6.2 % — ABNORMAL HIGH (ref 4.8–5.6)
Mean Plasma Glucose: 131.24 mg/dL

## 2018-12-06 NOTE — Progress Notes (Signed)
PCP - Dr. Doreene Nest Cardiologist - no  Chest x-ray - no EKG - 12/06/18 Stress Test - no ECHO -no  Cardiac Cath - no  Sleep Study - no CPAP -   Fasting Blood Sugar - Pt dose not test Checks Blood Sugar _____ times a day  Blood Thinner Instructions Aspirin Instructions: Last Dose:  Anesthesia review:   Patient denies shortness of breath, fever, cough and chest pain at PAT appointment  yes Patient verbalized understanding of instructions that were given to them at the PAT appointment. Patient was also instructed that they will need to review over the PAT instructions again at home before surgery. Yes Pt told he should stay with someone after surgery. He has the next door neighbor who can help.

## 2018-12-07 ENCOUNTER — Observation Stay (HOSPITAL_COMMUNITY)
Admission: RE | Admit: 2018-12-07 | Discharge: 2018-12-08 | Disposition: A | Discharge status: Home / Self Care | Payer: Medicare Other | Source: Ambulatory Visit | Attending: Urology | Admitting: Urology

## 2018-12-07 ENCOUNTER — Telehealth (HOSPITAL_COMMUNITY): Payer: Self-pay | Admitting: *Deleted

## 2018-12-07 ENCOUNTER — Ambulatory Visit (HOSPITAL_COMMUNITY): Payer: Medicare Other | Admitting: Physician Assistant

## 2018-12-07 ENCOUNTER — Encounter (HOSPITAL_COMMUNITY)
Admission: RE | Disposition: A | Discharge status: Home / Self Care | Payer: Self-pay | Source: Ambulatory Visit | Attending: Urology

## 2018-12-07 ENCOUNTER — Other Ambulatory Visit: Payer: Self-pay

## 2018-12-07 ENCOUNTER — Encounter (HOSPITAL_COMMUNITY): Payer: Self-pay

## 2018-12-07 ENCOUNTER — Ambulatory Visit (HOSPITAL_COMMUNITY): Payer: Medicare Other | Admitting: Anesthesiology

## 2018-12-07 DIAGNOSIS — E119 Type 2 diabetes mellitus without complications: Secondary | ICD-10-CM | POA: Insufficient documentation

## 2018-12-07 DIAGNOSIS — Z7984 Long term (current) use of oral hypoglycemic drugs: Secondary | ICD-10-CM | POA: Insufficient documentation

## 2018-12-07 DIAGNOSIS — Z87891 Personal history of nicotine dependence: Secondary | ICD-10-CM | POA: Insufficient documentation

## 2018-12-07 DIAGNOSIS — C679 Malignant neoplasm of bladder, unspecified: Principal | ICD-10-CM | POA: Insufficient documentation

## 2018-12-07 DIAGNOSIS — R310 Gross hematuria: Secondary | ICD-10-CM | POA: Diagnosis present

## 2018-12-07 DIAGNOSIS — I10 Essential (primary) hypertension: Secondary | ICD-10-CM | POA: Diagnosis not present

## 2018-12-07 DIAGNOSIS — D494 Neoplasm of unspecified behavior of bladder: Secondary | ICD-10-CM | POA: Diagnosis present

## 2018-12-07 HISTORY — PX: TRANSURETHRAL RESECTION OF BLADDER TUMOR: SHX2575

## 2018-12-07 LAB — HEMOGLOBIN AND HEMATOCRIT, BLOOD
HCT: 29.4 % — ABNORMAL LOW (ref 39.0–52.0)
Hemoglobin: 9.7 g/dL — ABNORMAL LOW (ref 13.0–17.0)

## 2018-12-07 LAB — GLUCOSE, CAPILLARY
Glucose-Capillary: 167 mg/dL — ABNORMAL HIGH (ref 70–99)
Glucose-Capillary: 184 mg/dL — ABNORMAL HIGH (ref 70–99)
Glucose-Capillary: 179 mg/dL — ABNORMAL HIGH (ref 70–99)

## 2018-12-07 LAB — POCT I-STAT, CHEM 8
BUN: 17 mg/dL (ref 8–23)
Calcium, Ion: 1.1 mmol/L — ABNORMAL LOW (ref 1.15–1.40)
Chloride: 93 mmol/L — ABNORMAL LOW (ref 98–111)
Creatinine, Ser: 1.1 mg/dL (ref 0.61–1.24)
Glucose, Bld: 163 mg/dL — ABNORMAL HIGH (ref 70–99)
HCT: 33 % — ABNORMAL LOW (ref 39.0–52.0)
Hemoglobin: 11.2 g/dL — ABNORMAL LOW (ref 13.0–17.0)
Potassium: 2.8 mmol/L — ABNORMAL LOW (ref 3.5–5.1)
Sodium: 133 mmol/L — ABNORMAL LOW (ref 135–145)
TCO2: 27 mmol/L (ref 22–32)

## 2018-12-07 SURGERY — TURBT (TRANSURETHRAL RESECTION OF BLADDER TUMOR)
Anesthesia: General

## 2018-12-07 MED ORDER — ONDANSETRON HCL 4 MG/2ML IJ SOLN
INTRAMUSCULAR | Status: AC
  Filled 2018-12-07: qty 2

## 2018-12-07 MED ORDER — SUGAMMADEX SODIUM 500 MG/5ML IV SOLN
INTRAVENOUS | Status: AC
  Filled 2018-12-07: qty 5

## 2018-12-07 MED ORDER — DEXAMETHASONE SODIUM PHOSPHATE 10 MG/ML IJ SOLN
INTRAMUSCULAR | Status: DC | PRN
  Administered 2018-12-07: 4 mg via INTRAVENOUS

## 2018-12-07 MED ORDER — ROCURONIUM BROMIDE 10 MG/ML (PF) SYRINGE
PREFILLED_SYRINGE | INTRAVENOUS | Status: AC
  Filled 2018-12-07: qty 10

## 2018-12-07 MED ORDER — INSULIN ASPART 100 UNIT/ML ~~LOC~~ SOLN
0.0000 [IU] | Freq: Three times a day (TID) | SUBCUTANEOUS | Status: DC
  Administered 2018-12-08 (×2): 2 [IU] via SUBCUTANEOUS

## 2018-12-07 MED ORDER — HYDROCODONE-ACETAMINOPHEN 5-325 MG PO TABS: 1.0000 | ORAL_TABLET | ORAL | 0 refills | Status: DC | PRN

## 2018-12-07 MED ORDER — LIDOCAINE 2% (20 MG/ML) 5 ML SYRINGE
INTRAMUSCULAR | Status: DC | PRN
  Administered 2018-12-07: 80 mg via INTRAVENOUS

## 2018-12-07 MED ORDER — SUCCINYLCHOLINE CHLORIDE 200 MG/10ML IV SOSY
PREFILLED_SYRINGE | INTRAVENOUS | Status: DC | PRN
  Administered 2018-12-07: 100 mg via INTRAVENOUS

## 2018-12-07 MED ORDER — FENTANYL CITRATE (PF) 100 MCG/2ML IJ SOLN
INTRAMUSCULAR | Status: AC
  Filled 2018-12-07: qty 2

## 2018-12-07 MED ORDER — PROPOFOL 10 MG/ML IV BOLUS
INTRAVENOUS | Status: DC | PRN
  Administered 2018-12-07: 120 mg via INTRAVENOUS
  Administered 2018-12-07: 30 mg via INTRAVENOUS

## 2018-12-07 MED ORDER — FENTANYL CITRATE (PF) 100 MCG/2ML IJ SOLN: 25.0000 ug | INTRAMUSCULAR | Status: DC | PRN

## 2018-12-07 MED ORDER — DEXAMETHASONE SODIUM PHOSPHATE 10 MG/ML IJ SOLN
INTRAMUSCULAR | Status: AC
  Filled 2018-12-07: qty 1

## 2018-12-07 MED ORDER — CEFAZOLIN SODIUM-DEXTROSE 2-4 GM/100ML-% IV SOLN
2.0000 g | INTRAVENOUS | Status: AC
  Administered 2018-12-07: 2 g via INTRAVENOUS
  Filled 2018-12-07: qty 100

## 2018-12-07 MED ORDER — SUGAMMADEX SODIUM 500 MG/5ML IV SOLN
INTRAVENOUS | Status: DC | PRN
  Administered 2018-12-07: 300 mg via INTRAVENOUS
  Administered 2018-12-07: 100 mg via INTRAVENOUS

## 2018-12-07 MED ORDER — LIDOCAINE 2% (20 MG/ML) 5 ML SYRINGE
INTRAMUSCULAR | Status: AC
  Filled 2018-12-07: qty 5

## 2018-12-07 MED ORDER — 0.9 % SODIUM CHLORIDE (POUR BTL) OPTIME
TOPICAL | Status: DC | PRN
  Administered 2018-12-07: 1000 mL

## 2018-12-07 MED ORDER — PROPOFOL 10 MG/ML IV BOLUS
INTRAVENOUS | Status: AC
  Filled 2018-12-07: qty 20

## 2018-12-07 MED ORDER — FENTANYL CITRATE (PF) 100 MCG/2ML IJ SOLN
INTRAMUSCULAR | Status: DC | PRN
  Administered 2018-12-07: 50 ug via INTRAVENOUS
  Administered 2018-12-07: 25 ug via INTRAVENOUS
  Administered 2018-12-07: 100 ug via INTRAVENOUS
  Administered 2018-12-07: 50 ug via INTRAVENOUS
  Administered 2018-12-07: 25 ug via INTRAVENOUS
  Administered 2018-12-07 (×3): 50 ug via INTRAVENOUS

## 2018-12-07 MED ORDER — ONDANSETRON HCL 4 MG/2ML IJ SOLN
INTRAMUSCULAR | Status: DC | PRN
  Administered 2018-12-07: 4 mg via INTRAVENOUS

## 2018-12-07 MED ORDER — SODIUM CHLORIDE 0.9 % IR SOLN
Status: DC | PRN
  Administered 2018-12-07: 69000 mL

## 2018-12-07 MED ORDER — LACTATED RINGERS IV SOLN
INTRAVENOUS | Status: DC
  Administered 2018-12-07 – 2018-12-08 (×4): via INTRAVENOUS

## 2018-12-07 MED ORDER — SUCCINYLCHOLINE CHLORIDE 200 MG/10ML IV SOSY
PREFILLED_SYRINGE | INTRAVENOUS | Status: AC
  Filled 2018-12-07: qty 10

## 2018-12-07 MED ORDER — PHENYLEPHRINE 40 MCG/ML (10ML) SYRINGE FOR IV PUSH (FOR BLOOD PRESSURE SUPPORT)
PREFILLED_SYRINGE | INTRAVENOUS | Status: AC
  Filled 2018-12-07: qty 10

## 2018-12-07 MED ORDER — PHENYLEPHRINE 40 MCG/ML (10ML) SYRINGE FOR IV PUSH (FOR BLOOD PRESSURE SUPPORT)
PREFILLED_SYRINGE | INTRAVENOUS | Status: DC | PRN
  Administered 2018-12-07: 80 ug via INTRAVENOUS

## 2018-12-07 MED ORDER — ROCURONIUM BROMIDE 10 MG/ML (PF) SYRINGE
PREFILLED_SYRINGE | INTRAVENOUS | Status: DC | PRN
  Administered 2018-12-07: 10 mg via INTRAVENOUS
  Administered 2018-12-07: 40 mg via INTRAVENOUS
  Administered 2018-12-07: 10 mg via INTRAVENOUS

## 2018-12-07 SURGICAL SUPPLY — 20 items
BAG DRN RND TRDRP ANRFLXCHMBR (UROLOGICAL SUPPLIES) ×1
BAG URINE DRAIN 2000ML AR STRL (UROLOGICAL SUPPLIES) ×2 IMPLANT
BAG URO CATCHER STRL LF (MISCELLANEOUS) ×3 IMPLANT
CATH FOLEY 2WAY SLVR  5CC 18FR (CATHETERS)
CATH FOLEY 2WAY SLVR 5CC 18FR (CATHETERS) IMPLANT
CATH HEMA 3WAY 30CC 24FR COUDE (CATHETERS) ×2 IMPLANT
ELECT REM PT RETURN 15FT ADLT (MISCELLANEOUS) ×1 IMPLANT
GLOVE BIO SURGEON STRL SZ7.5 (GLOVE) ×3 IMPLANT
GOWN STRL REUS W/TWL XL LVL3 (GOWN DISPOSABLE) ×3 IMPLANT
KIT TURNOVER KIT A (KITS) ×2 IMPLANT
LOOP CUT BIPOLAR 24F LRG (ELECTROSURGICAL) ×4 IMPLANT
MANIFOLD NEPTUNE II (INSTRUMENTS) ×3 IMPLANT
PACK CYSTO (CUSTOM PROCEDURE TRAY) ×3 IMPLANT
PLUG CATH AND CAP STER (CATHETERS) IMPLANT
SYR 30ML LL (SYRINGE) ×2 IMPLANT
SYRINGE IRR TOOMEY STRL 70CC (SYRINGE) ×2 IMPLANT
TUBING CONNECTING 10 (TUBING) ×2 IMPLANT
TUBING CONNECTING 10' (TUBING) ×1
TUBING UROLOGY SET (TUBING) ×3 IMPLANT
WATER STERILE IRR 500ML POUR (IV SOLUTION) ×2 IMPLANT

## 2018-12-07 NOTE — H&P (Signed)
CC/HPI: CC: Gross hematuria, bladder mass  HPI:  11/26/2018  73 year old male 1st noted gross hematuria about 2 years ago. This resolved. He had another episode about 1 year ago that also resolved after several episodes. Most recently, he started having gross hematuria about 1 week ago. He went to the emergency department on 11/20/2018. He was found to have a large amount of heterogeneous blood products within the bladder with heterogeneous soft tissue mass/thickening along the posterior bladder wall. He continues to have gross hematuria. He passes some clots. He is able to void. On CT scan, the prostate gland was heterogeneous and abutted the posterior surface of the bladder. He had no hydronephrosis. No pelvic lymphadenopathy. Creatinine was 1.15. Hemoglobin was 16.2.     ALLERGIES: No Allergies    MEDICATIONS: Lisinopril 10 mg tablet  MetFORMIN HCl - 1000 MG Oral Tablet Oral     GU PSH: No GU PSH      PSH Notes: Cholecystectomy, Skull Repair   NON-GU PSH: Cholecystectomy (open) - 2013     GU PMH: BPH w/LUTS, Benign localized prostatic hyperplasia with lower urinary tract symptoms (LUTS) - 2014 Elevated PSA, Elevated prostate specific antigen (PSA) - 2014    NON-GU PMH: Asthma, Asthma - 2014 Personal history of other diseases of the circulatory system, History of hypertension - 2014 Personal history of other endocrine, nutritional and metabolic disease, History of diabetes mellitus - 2014, History of hypercholesterolemia, - 2014 Personal history of other mental and behavioral disorders, History of depression - 2014 Personal history of other specified conditions, History of heartburn - 2014 Depression Encounter for general adult medical examination without abnormal findings, Encounter for preventive health examination GERD Hypertension    FAMILY HISTORY: 1 Daughter - Other Family Health Status Number - Runs In Family Father Deceased At Age64 ___ - Runs In Family Mother  Deceased At Age 58 from diabetic complicati - Runs In Family stroke - Mother   SOCIAL HISTORY: Marital Status: Divorced Preferred Language: English; Race: White Current Smoking Status: Patient does not smoke anymore. Has not smoked since 11/24/1977.   Tobacco Use Assessment Completed: Used Tobacco in last 30 days? Does not drink caffeine.     Notes: Tobacco use, Occupation:, Marital History - Divorced, Caffeine Use, Alcohol Use   REVIEW OF SYSTEMS:    GU Review Male:   Patient reports frequent urination, hard to postpone urination, burning/ pain with urination, get up at night to urinate, leakage of urine, stream starts and stops, trouble starting your stream, and have to strain to urinate . Patient denies erection problems and penile pain.  Gastrointestinal (Upper):   Patient reports indigestion/ heartburn. Patient denies nausea and vomiting.  Gastrointestinal (Lower):   Patient reports diarrhea and constipation.   Constitutional:   Patient reports fatigue. Patient denies fever, night sweats, and weight loss.  Skin:   Patient denies skin rash/ lesion and itching.  Eyes:   Patient denies blurred vision and double vision.  Ears/ Nose/ Throat:   Patient reports sinus problems. Patient denies sore throat.  Hematologic/Lymphatic:   Patient reports easy bruising. Patient denies swollen glands.  Cardiovascular:   Patient denies leg swelling and chest pains.  Respiratory:   Patient denies cough and shortness of breath.  Endocrine:   Patient denies excessive thirst.  Musculoskeletal:   Patient reports back pain. Patient denies joint pain.  Neurological:   Patient denies headaches and dizziness.  Psychologic:   Patient reports depression. Patient denies anxiety.   Notes: hematuria weak stream  VITAL SIGNS:      11/26/2018 04:16 PM  Weight 240 lb / 108.86 kg  Height 73 in / 185.42 cm  BP 124/70 mmHg  Heart Rate 102 /min  Temperature 98.0 F / 36.6 C  BMI 31.7 kg/m   MULTI-SYSTEM  PHYSICAL EXAMINATION:    Constitutional: Well-nourished. No physical deformities. Normally developed. Good grooming.  Respiratory: No labored breathing, no use of accessory muscles.   Cardiovascular: Normal temperature, adequate perfusion of extremities  Skin: No paleness, no jaundice  Neurologic / Psychiatric: Oriented to time, oriented to place, oriented to person. No depression, no anxiety, no agitation.  Gastrointestinal: No mass, no tenderness, no rigidity, obese abdomen.   Eyes: Normal conjunctivae. Normal eyelids.  Musculoskeletal: Normal gait and station of head and neck.     PAST DATA REVIEWED:  Source Of History:  Patient  Records Review:   Previous Doctor Records, Previous Patient Records  X-Ray Review: C.T. Abdomen/Pelvis: Reviewed Films. Reviewed Report. Discussed With Patient.     PROCEDURES:         Flexible Cystoscopy - 52000  Risks, benefits, and some of the potential complications of the procedure were discussed at length with the patient including infection, bleeding, voiding discomfort, urinary retention, fever, chills, sepsis, and others. All questions were answered. Informed consent was obtained. Antibiotic prophylaxis was given. Sterile technique and intraurethral analgesia were used.  Meatus:  Normal size. Normal location. Normal condition.  Urethra:  No strictures.  External Sphincter:  Normal.  Verumontanum:  Normal.  Prostate:  Borderline obstructing. Mild hyperplasia.  Bladder Neck:  Non-obstructing.  Ureteral Orifices:  Normal location. Normal size. Normal shape.   Bladder:  He had some clot in the bladder, somewhat impeding visualization. Within limitation, there was definitely papillary tumor on the posterior bladder wall close to the trigone.      The lower urinary tract was carefully examined. The procedure was well-tolerated and without complications. Antibiotic instructions were given. Instructions were given to call the office immediately for bloody  urine, difficulty urinating, urinary retention, painful or frequent urination, fever, chills, nausea, vomiting or other illness. The patient stated that he understood these instructions and would comply with them.         Urinalysis w/Scope Dipstick Dipstick Cont'd Micro  Color: Red Bilirubin: Invalid mg/dL WBC/hpf: NS (Not Seen)  Appearance: Turbid Ketones: Invalid mg/dL RBC/hpf: Packed/hpf  Specific Gravity: Invalid Blood: Invalid ery/uL Bacteria: NS (Not Seen)  pH: Invalid Protein: Invalid mg/dL Cystals: NS (Not Seen)  Glucose: Invalid mg/dL Urobilinogen: Invalid mg/dL Casts: NS (Not Seen)    Nitrites: Invalid Trichomonas: Not Present    Leukocyte Esterase: Invalid leu/uL Mucous: Not Present      Epithelial Cells: NS (Not Seen)      Yeast: NS (Not Seen)      Sperm: Not Present    Notes: UNABLE TO DIFFERNCIATE DUE TO PACKED RBCs. UNABLE TO DIFFERENCIATE DUE TO PACKED RBCs UNABLE TO DIFFERNCIATE DUE TO PACKED RBCs.    ASSESSMENT:      ICD-10 Details  1 GU:   Gross hematuria - R31.0   2   Bladder tumor/neoplasm - D41.4    PLAN:           Document Letter(s):  Created for Patient: Clinical Summary         Notes:   Plan for TURBT. He understands potential for bleeding, infection, injury to surrounding structures, need for additional procedures.   Cc: Dr. Doreene Nest        Next  Appointment:      Next Appointment: 12/07/2018 03:00 PM    Appointment Type: Surgery     Location: [facility] [phone]    Provider: Link Snuffer, III, M.D.    Reason for Visit: OP WL TURBT     Signed by Link Snuffer, III, M.D. on 11/28/18 at 12:58 PM (EST

## 2018-12-07 NOTE — Discharge Instructions (Signed)

## 2018-12-07 NOTE — Progress Notes (Signed)
Patient arrived to unit. Wound right buttock. Foley cherry red urine, no clots present.

## 2018-12-07 NOTE — Anesthesia Postprocedure Evaluation (Signed)
Anesthesia Post Note  Patient: [name].  Procedure(s) Performed: TRANSURETHRAL RESECTION OF BLADDER TUMOR (TURBT) (N/A )     Patient location during evaluation: PACU Anesthesia Type: General Level of consciousness: awake and alert Pain management: pain level controlled Vital Signs Assessment: post-procedure vital signs reviewed and stable Respiratory status: spontaneous breathing, nonlabored ventilation, respiratory function stable and patient connected to nasal cannula oxygen Cardiovascular status: blood pressure returned to baseline and stable Postop Assessment: no apparent nausea or vomiting Anesthetic complications: no    Last Vitals:  Vitals:   12/07/18 1815 12/07/18 1830  BP: 129/73   Pulse: 93 92  Resp: 18 17  Temp:  36.9 C  SpO2: 100% 100%    Last Pain:  Vitals:   12/07/18 1830  TempSrc:   PainSc: 3                  Yaxiel Minnie,W. EDMOND

## 2018-12-07 NOTE — Transfer of Care (Signed)
Immediate Anesthesia Transfer of Care Note  Patient: [name].  Procedure(s) Performed: TRANSURETHRAL RESECTION OF BLADDER TUMOR (TURBT) (N/A )  Patient Location: PACU  Anesthesia Type:General  Level of Consciousness: awake, alert , oriented and patient cooperative  Airway & Oxygen Therapy: Patient Spontanous Breathing and Patient connected to face mask oxygen  Post-op Assessment: Report given to RN, Post -op Vital signs reviewed and stable and Patient moving all extremities X 4  Post vital signs: stable  Last Vitals:  Vitals Value Taken Time  BP 134/74 12/07/18 1800  Temp    Pulse 94 12/07/18 1800  Resp 17 12/07/18 1800  SpO2 100 % 12/07/18 1800  Vitals shown include unvalidated device data.  Last Pain:  Vitals:   12/07/18 1353  TempSrc:   PainSc: 0-No pain         Complications: No apparent anesthesia complications

## 2018-12-07 NOTE — Op Note (Signed)
Operative Note  Preoperative diagnosis:  1.  Bladder tumor  Postoperative diagnosis: 1.  Bladder tumor--large  Procedure(s): 1.  Transurethral resection of bladder tumor--large  Surgeon: Link Snuffer, MD  Assistants: None  Anesthesia: General  Complications: None immediate  EBL: 100 cc  Specimens: 1.  Bladder tumor  Drains/Catheters: 1.  24 French three-way catheter  Intraoperative findings: 1.  Normal anterior urethra 2.  Obstructing prostate with large median lobe 3.  Unable to identify bilateral ureteral orifices.  The majority of the posterior bladder wall was papillary bladder tumor that appeared invasive.  This extended laterally along the right side.  The entire tumor was unable to be completely resected.  The tumor was greater than 6 cm  Indication: [name] with gross hematuria found to have a bladder tumor presents for the previously mentioned operation.  Description of procedure:  The patient was identified and consent was obtained.  The patient was taken to the operating room and placed in the supine position.  The patient was placed under general anesthesia.  Perioperative antibiotics were administered.  The patient was placed in dorsal lithotomy.  Patient was prepped and draped in a standard sterile fashion and a timeout was performed.  A 26 French resectoscope with a visual obturator in place was advanced into the urethra and into the bladder.  Clot was evacuated.  I then exchanged this for the working element and on bipolar settings resected very large amount of tumor.  This was all collected.  I spent a great deal time fulgurating any bleeding.  I was unable to completely resect all of the tumor due to the extent of tumor in the bladder, especially on the right lateral wall.  I was unable to visualize either ureteral orifice.  I did have to resect deep in multiple areas.  For this reason, I decided to leave a large bore 24 [place] hematuria catheter.  The  urine/output was light pink.  Since I had to resect relatively deep in a couple of areas, I did not initiate continuous bladder irrigation.  However, I will reassess and if his urine suggest that he will need it, I will start careful and slow continuous bladder irrigation.  After placement of the catheter, this include the operation.  The patient tolerated procedure well was stable postoperatively.  Plan: I will keep him on observation overnight.  Hold off on continuous bladder irrigation unless he needs it.  Follow hemoglobin postoperatively and tomorrow morning.  He will go home with the catheter and we will set up a voiding trial.

## 2018-12-07 NOTE — Anesthesia Preprocedure Evaluation (Addendum)
Anesthesia Evaluation  Patient identified by MRN, date of birth, ID band Patient awake    Reviewed: Allergy & Precautions, H&P , NPO status , Patient's Chart, lab work & pertinent test results  Airway Mallampati: III  TM Distance: >3 FB Neck ROM: Full    Dental no notable dental hx. (+) Poor Dentition, Dental Advisory Given   Pulmonary neg pulmonary ROS, former smoker,    Pulmonary exam normal breath sounds clear to auscultation       Cardiovascular hypertension, Pt. on medications  Rhythm:Regular Rate:Normal     Neuro/Psych negative neurological ROS  negative psych ROS   GI/Hepatic negative GI ROS, Neg liver ROS,   Endo/Other  diabetes, Type 2, Oral Hypoglycemic Agents  Renal/GU negative Renal ROS  negative genitourinary   Musculoskeletal   Abdominal   Peds  Hematology negative hematology ROS (+)   Anesthesia Other Findings   Reproductive/Obstetrics negative OB ROS                            Anesthesia Physical Anesthesia Plan  ASA: II  Anesthesia Plan: General   Post-op Pain Management:    Induction: Intravenous  PONV Risk Score and Plan: 3 and Dexamethasone, Ondansetron and Treatment may vary due to age or medical condition  Airway Management Planned: Oral ETT  Additional Equipment:   Intra-op Plan:   Post-operative Plan: Extubation in OR  Informed Consent: I have reviewed the patients History and Physical, chart, labs and discussed the procedure including the risks, benefits and alternatives for the proposed anesthesia with the patient or authorized representative who has indicated his/her understanding and acceptance.     Dental advisory given  Plan Discussed with: CRNA  Anesthesia Plan Comments:        Anesthesia Quick Evaluation

## 2018-12-07 NOTE — Anesthesia Procedure Notes (Signed)
Procedure Name: Intubation Date/Time: 12/07/2018 3:07 PM Performed by: Mitzie Na, CRNA Pre-anesthesia Checklist: Patient identified, Emergency Drugs available, Suction available and Patient being monitored Patient Re-evaluated:Patient Re-evaluated prior to induction Oxygen Delivery Method: Circle system utilized Preoxygenation: Pre-oxygenation with 100% oxygen Induction Type: IV induction and Rapid sequence Laryngoscope Size: Glidescope and 3 Grade View: Grade I Tube type: Oral Tube size: 7.5 mm Number of attempts: 1 Airway Equipment and Method: Stylet and Oral airway Placement Confirmation: ETT inserted through vocal cords under direct vision,  positive ETCO2 and breath sounds checked- equal and bilateral Secured at: 24 cm Tube secured with: Tape Dental Injury: Teeth and Oropharynx as per pre-operative assessment

## 2018-12-08 ENCOUNTER — Encounter (HOSPITAL_COMMUNITY): Payer: Self-pay | Admitting: Urology

## 2018-12-08 DIAGNOSIS — C679 Malignant neoplasm of bladder, unspecified: Secondary | ICD-10-CM | POA: Diagnosis not present

## 2018-12-08 LAB — HEMOGLOBIN AND HEMATOCRIT, BLOOD
HCT: 25.1 % — ABNORMAL LOW (ref 39.0–52.0)
Hemoglobin: 8.3 g/dL — ABNORMAL LOW (ref 13.0–17.0)

## 2018-12-08 LAB — BASIC METABOLIC PANEL
Anion gap: 9 (ref 5–15)
BUN: 17 mg/dL (ref 8–23)
CO2: 30 mmol/L (ref 22–32)
Calcium: 8.3 mg/dL — ABNORMAL LOW (ref 8.9–10.3)
Chloride: 96 mmol/L — ABNORMAL LOW (ref 98–111)
Creatinine, Ser: 1.24 mg/dL (ref 0.61–1.24)
GFR calc Af Amer: >60 mL/min (ref >60)
GFR calc non Af Amer: 57 mL/min — ABNORMAL LOW (ref >60)
Glucose, Bld: 139 mg/dL — ABNORMAL HIGH (ref 70–99)
Potassium: 3.1 mmol/L — ABNORMAL LOW (ref 3.5–5.1)
Sodium: 135 mmol/L (ref 135–145)

## 2018-12-08 LAB — GLUCOSE, CAPILLARY
Glucose-Capillary: 126 mg/dL — ABNORMAL HIGH (ref 70–99)
Glucose-Capillary: 125 mg/dL — ABNORMAL HIGH (ref 70–99)

## 2018-12-08 MED ORDER — SENNA 8.6 MG PO TABS
1.0000 | ORAL_TABLET | Freq: Two times a day (BID) | ORAL | Status: DC
  Administered 2018-12-08: 8.6 mg via ORAL
  Filled 2018-12-08: qty 1

## 2018-12-08 MED ORDER — ONDANSETRON HCL 4 MG/2ML IJ SOLN: 4.0000 mg | INTRAMUSCULAR | Status: DC | PRN

## 2018-12-08 MED ORDER — BELLADONNA ALKALOIDS-OPIUM 16.2-60 MG RE SUPP: 1.0000 | Freq: Four times a day (QID) | RECTAL | Status: DC | PRN

## 2018-12-08 MED ORDER — HYDROMORPHONE HCL 1 MG/ML IJ SOLN: 0.5000 mg | INTRAMUSCULAR | Status: DC | PRN

## 2018-12-08 MED ORDER — DOCUSATE SODIUM 100 MG PO CAPS
100.0000 mg | ORAL_CAPSULE | Freq: Two times a day (BID) | ORAL | Status: DC
  Administered 2018-12-08: 100 mg via ORAL
  Filled 2018-12-08: qty 1

## 2018-12-08 MED ORDER — DIPHENHYDRAMINE HCL 50 MG/ML IJ SOLN: 12.5000 mg | Freq: Four times a day (QID) | INTRAMUSCULAR | Status: DC | PRN

## 2018-12-08 MED ORDER — HYDROCHLOROTHIAZIDE 12.5 MG PO CAPS
12.5000 mg | ORAL_CAPSULE | Freq: Every day | ORAL | Status: DC
  Administered 2018-12-08: 12.5 mg via ORAL
  Filled 2018-12-08: qty 1

## 2018-12-08 MED ORDER — LISINOPRIL-HYDROCHLOROTHIAZIDE 10-12.5 MG PO TABS: 1.0000 | ORAL_TABLET | Freq: Every day | ORAL | Status: DC

## 2018-12-08 MED ORDER — OXYCODONE HCL 5 MG PO TABS: 5.0000 mg | ORAL_TABLET | ORAL | Status: DC | PRN

## 2018-12-08 MED ORDER — SODIUM CHLORIDE 0.9 % IV SOLN: INTRAVENOUS | Status: DC

## 2018-12-08 MED ORDER — LISINOPRIL 10 MG PO TABS
10.0000 mg | ORAL_TABLET | Freq: Every day | ORAL | Status: DC
  Administered 2018-12-08: 10 mg via ORAL
  Filled 2018-12-08: qty 1

## 2018-12-08 MED ORDER — BACITRACIN-NEOMYCIN-POLYMYXIN 400-5-5000 EX OINT: 1.0000 "application " | TOPICAL_OINTMENT | Freq: Three times a day (TID) | CUTANEOUS | Status: DC | PRN

## 2018-12-08 MED ORDER — DIPHENHYDRAMINE HCL 12.5 MG/5ML PO ELIX: 12.5000 mg | ORAL_SOLUTION | Freq: Four times a day (QID) | ORAL | Status: DC | PRN

## 2018-12-08 MED ORDER — CITALOPRAM HYDROBROMIDE 20 MG PO TABS
20.0000 mg | ORAL_TABLET | Freq: Every day | ORAL | Status: DC
  Administered 2018-12-08: 20 mg via ORAL
  Filled 2018-12-08: qty 1

## 2018-12-08 MED ORDER — OXYBUTYNIN CHLORIDE 5 MG PO TABS: 5.0000 mg | ORAL_TABLET | Freq: Three times a day (TID) | ORAL | Status: DC | PRN

## 2018-12-08 MED ORDER — ZOLPIDEM TARTRATE 5 MG PO TABS: 5.0000 mg | ORAL_TABLET | Freq: Every evening | ORAL | Status: DC | PRN

## 2018-12-08 MED ORDER — OXYBUTYNIN CHLORIDE 5 MG PO TABS: 5.0000 mg | ORAL_TABLET | Freq: Three times a day (TID) | ORAL | 0 refills | Status: DC | PRN

## 2018-12-08 MED ORDER — CHLORHEXIDINE GLUCONATE CLOTH 2 % EX PADS: 6.0000 | MEDICATED_PAD | Freq: Every day | CUTANEOUS | Status: DC

## 2018-12-08 MED ORDER — ACETAMINOPHEN 325 MG PO TABS: 650.0000 mg | ORAL_TABLET | ORAL | Status: DC | PRN

## 2018-12-08 NOTE — Discharge Summary (Signed)
Patient ID: [name]. MRN: [id number] DOB/AGE: September 24, 1945 73 y.o.  Admit date: 12/07/2018 Discharge date: 12/08/2018  Primary Care Physician:  Katherina Mires, MD  Discharge Diagnoses: Bladder tumor, overlapping sites Present on Admission: . Bladder tumor   Consults: None   Discharge Medications: Allergies as of 12/08/2018   No Known Allergies     Medication List    STOP taking these medications   aspirin 81 MG tablet     TAKE these medications   citalopram 20 MG tablet Commonly known as: CELEXA Take 20 mg by mouth daily.   HYDROcodone-acetaminophen 5-325 MG tablet Commonly known as: Norco Take 1 tablet by mouth every 4 (four) hours as needed for moderate pain.   lisinopril-hydrochlorothiazide 10-12.5 MG tablet Commonly known as: ZESTORETIC Take 1 tablet by mouth daily.   metFORMIN 500 MG tablet Commonly known as: GLUCOPHAGE Take 500 mg by mouth daily with breakfast.   oxybutynin 5 MG tablet Commonly known as: DITROPAN Take 1 tablet (5 mg total) by mouth every 8 (eight) hours as needed for bladder spasms.        Significant Diagnostic Studies:  No results found.  Brief H and P: For complete details please refer to admission H and P, but in brief the patient is admitted for endoscopic management of the large symptomatic bladder tumor  Hospital Course: The patient was admitted directly to the operating room for TURBT of a very large bladder tumor.  He tolerated his procedure well.  Postoperatively, he had no issues with significant hematuria.  No irrigation was necessary.  The patient was discharged in stable condition on postoperative day #1.  He has follow-up scheduled in our office in approximately 3 days for catheter removal.   Day of Discharge BP (!) 106/59 (BP Location: Right Arm)   Pulse 78   Temp 98.6 F (37 C) (Oral)   Resp 18   Wt 106.7 kg   SpO2 97%   BMI 31.90 kg/m   Results for orders placed or performed during the hospital  encounter of 12/07/18 (from the past 24 hour(s))  Glucose, capillary     Status: Abnormal   Collection Time: 12/07/18  1:32 PM  Result Value Ref Range   Glucose-Capillary 167 (H) 70 - 99 mg/dL  I-STAT, chem 8     Status: Abnormal   Collection Time: 12/07/18  4:14 PM  Result Value Ref Range   Sodium 133 (L) 135 - 145 mmol/L   Potassium 2.8 (L) 3.5 - 5.1 mmol/L   Chloride 93 (L) 98 - 111 mmol/L   BUN 17 8 - 23 mg/dL   Creatinine, Ser 1.10 0.61 - 1.24 mg/dL   Glucose, Bld 163 (H) 70 - 99 mg/dL   Calcium, Ion 1.10 (L) 1.15 - 1.40 mmol/L   TCO2 27 22 - 32 mmol/L   Hemoglobin 11.2 (L) 13.0 - 17.0 g/dL   HCT 33.0 (L) 39.0 - 52.0 %  Glucose, capillary     Status: Abnormal   Collection Time: 12/07/18  6:03 PM  Result Value Ref Range   Glucose-Capillary 184 (H) 70 - 99 mg/dL   Comment 1 Document in Chart   Hemoglobin and hematocrit, blood     Status: Abnormal   Collection Time: 12/07/18  6:04 PM  Result Value Ref Range   Hemoglobin 9.7 (L) 13.0 - 17.0 g/dL   HCT 29.4 (L) 39.0 - 52.0 %  Glucose, capillary     Status: Abnormal   Collection Time: 12/07/18  8:58  PM  Result Value Ref Range   Glucose-Capillary 179 (H) 70 - 99 mg/dL   Comment 1 Notify RN    Comment 2 Document in Chart   Glucose, capillary     Status: Abnormal   Collection Time: 12/08/18  7:48 AM  Result Value Ref Range   Glucose-Capillary 126 (H) 70 - 99 mg/dL    Physical Exam: General: Alert and awake oriented x3 not in any acute distress. HEENT: anicteric sclera, pupils reactive to light and accommodation CVS: S1-S2 clear no murmur rubs or gallops Chest: clear to auscultation bilaterally, no wheezing rales or rhonchi Abdomen: soft nontender, nondistended, normal bowel sounds, no organomegaly Extremities: no cyanosis, clubbing or edema noted bilaterally Neuro: Cranial nerves II-XII intact, no focal neurological deficits  Disposition: Home  Diet: No restrictions  Activity: Gradually increase   Disposition and  Follow-up:   He will follow-up in our office as scheduled in 3 days for catheter removal  TESTS THAT NEED FOLLOW-UP  Pathology review  DISCHARGE FOLLOW-UP  Follow-up Information    [facility] Follow up.   Why: as scheduled Contact information: [place] [phone]          Time spent on Discharge:  10 minutes  Signed: Lillette Boxer Lakeith Careaga 12/08/2018, 8:45 AM

## 2018-12-08 NOTE — Progress Notes (Signed)
Patient discharged home as ordered, with FC. Teaching done prior to D/C. Pt verbalized understanding and stated his neighbor would be able to assist him with catheter care. Pt sent with FC bag and leg bag and how to use each.

## 2018-12-10 LAB — SURGICAL PATHOLOGY

## 2018-12-18 ENCOUNTER — Other Ambulatory Visit: Payer: Self-pay | Admitting: Urology

## 2018-12-24 NOTE — Patient Instructions (Signed)
DUE TO COVID-19 ONLY ONE VISITOR IS ALLOWED TO COME WITH YOU AND STAY IN THE WAITING ROOM ONLY DURING PRE OP AND PROCEDURE DAY OF SURGERY. THE 1 VISITOR MAY VISIT WITH YOU AFTER SURGERY IN YOUR PRIVATE ROOM DURING VISITING HOURS ONLY!  YOU NEED TO HAVE A COVID 19 TEST ON___12-1____ @_______ , THIS TEST MUST BE DONE BEFORE SURGERY, COME  [place] , [id number].  ([place]) ONCE YOUR COVID TEST IS COMPLETED, PLEASE BEGIN THE QUARANTINE INSTRUCTIONS AS OUTLINED IN YOUR HANDOUT.                [place].    Your procedure is scheduled on: 12-4   Report to [facility]  Entrance   Report to admitting at 12:30PM     Call this number if you have problems the morning of surgery [phone]    Remember: Do not eat food After Midnight. YOU MAY HAVE CLEAR LIQUIDS FROM MIDNIGHT UNTIL 8:30AM. NOTHING BY MOUTH AFTER 8:30AM!   CLEAR LIQUID DIET   Foods Allowed                                                                     Foods Excluded  Coffee and tea, regular and decaf                             liquids that you cannot  Plain Jell-O any favor except red or purple                                           see through such as: Fruit ices (not with fruit pulp)                                     milk, soups, orange juice  Iced Popsicles                                    All solid food Carbonated beverages, regular and diet                                    Cranberry, grape and apple juices Sports drinks like Gatorade Lightly seasoned clear broth or consume(fat free) Sugar, honey syrup  Sample Menu Breakfast                                Lunch                                     Supper Cranberry juice                    Beef broth  Chicken broth Jell-O                                     Grape juice                           Apple juice Coffee or tea                        Jell-O                                       Popsicle                                                Coffee or tea                        Coffee or tea  _____________________________________________________________________  BRUSH YOUR TEETH MORNING OF SURGERY AND RINSE YOUR MOUTH OUT, NO CHEWING GUM CANDY OR MINTS.     Take these medicines the morning of surgery with A SIP OF WATER: CITALOPRAM, FLONASE IF NEEDED   DO NOT TAKE ANY DIABETIC MEDICATIONS DAY OF YOUR SURGERY                                You may not have any metal on your body including hair pins and              piercings  Do not wear jewelry, make-up, lotions, powders or perfumes, deodorant                        Men may shave face and neck.   Do not bring valuables to the hospital. [place].  Contacts, dentures or bridgework may not be worn into surgery.       Patients discharged the day of surgery will not be allowed to drive home. IF YOU ARE HAVING SURGERY AND GOING HOME THE SAME DAY, YOU MUST HAVE AN ADULT TO DRIVE YOU HOME AND BE WITH YOU FOR 24 HOURS. YOU MAY GO HOME BY TAXI OR UBER OR ORTHERWISE, BUT AN ADULT MUST ACCOMPANY YOU HOME AND STAY WITH YOU FOR 24 HOURS.  Name and phone number of your driver:  Special Instructions: N/A              Please read over the following fact sheets you were given: _____________________________________________________________________             [facility] - Preparing for Surgery Before surgery, you can play an important role.  Because skin is not sterile, your skin needs to be as free of germs as possible.  You can reduce the number of germs on your skin by washing with CHG (chlorahexidine gluconate) soap before surgery.  CHG is an antiseptic cleaner which kills germs and bonds with the skin to continue killing germs even after washing. Please DO NOT use if you have an  allergy to CHG or antibacterial soaps.  If your skin becomes reddened/irritated stop  using the CHG and inform your nurse when you arrive at Short Stay. Do not shave (including legs and underarms) for at least 48 hours prior to the first CHG shower.  You may shave your face/neck. Please follow these instructions carefully:  1.  Shower with CHG Soap the night before surgery and the  morning of Surgery.  2.  If you choose to wash your hair, wash your hair first as usual with your  normal  shampoo.  3.  After you shampoo, rinse your hair and body thoroughly to remove the  shampoo.                           4.  Use CHG as you would any other liquid soap.  You can apply chg directly  to the skin and wash                       Gently with a scrungie or clean washcloth.  5.  Apply the CHG Soap to your body ONLY FROM THE NECK DOWN.   Do not use on face/ open                           Wound or open sores. Avoid contact with eyes, ears mouth and genitals (private parts).                       Wash face,  Genitals (private parts) with your normal soap.             6.  Wash thoroughly, paying special attention to the area where your surgery  will be performed.  7.  Thoroughly rinse your body with warm water from the neck down.  8.  DO NOT shower/wash with your normal soap after using and rinsing off  the CHG Soap.                9.  Pat yourself dry with a clean towel.            10.  Wear clean pajamas.            11.  Place clean sheets on your bed the night of your first shower and do not  sleep with pets. Day of Surgery : Do not apply any lotions/deodorants the morning of surgery.  Please wear clean clothes to the hospital/surgery center.  FAILURE TO FOLLOW THESE INSTRUCTIONS MAY RESULT IN THE CANCELLATION OF YOUR SURGERY PATIENT SIGNATURE_________________________________  NURSE SIGNATURE__________________________________  ________________________________________________________________________  WHAT IS A BLOOD TRANSFUSION? Blood Transfusion Information  A transfusion is the  replacement of blood or some of its parts. Blood is made up of multiple cells which provide different functions.  Red blood cells carry oxygen and are used for blood loss replacement.  White blood cells fight against infection.  Platelets control bleeding.  Plasma helps clot blood.  Other blood products are available for specialized needs, such as hemophilia or other clotting disorders. BEFORE THE TRANSFUSION  Who gives blood for transfusions?   Healthy volunteers who are fully evaluated to make sure their blood is safe. This is blood bank blood. Transfusion therapy is the safest it has ever been in the practice of medicine. Before blood is taken from a donor, a complete history is taken to make sure that  person has no history of diseases nor engages in risky social behavior (examples are intravenous drug use or sexual activity with multiple partners). The donor's travel history is screened to minimize risk of transmitting infections, such as malaria. The donated blood is tested for signs of infectious diseases, such as HIV and hepatitis. The blood is then tested to be sure it is compatible with you in order to minimize the chance of a transfusion reaction. If you or a relative donates blood, this is often done in anticipation of surgery and is not appropriate for emergency situations. It takes many days to process the donated blood. RISKS AND COMPLICATIONS Although transfusion therapy is very safe and saves many lives, the main dangers of transfusion include:   Getting an infectious disease.  Developing a transfusion reaction. This is an allergic reaction to something in the blood you were given. Every precaution is taken to prevent this. The decision to have a blood transfusion has been considered carefully by your caregiver before blood is given. Blood is not given unless the benefits outweigh the risks. AFTER THE TRANSFUSION  Right after receiving a blood transfusion, you will usually  feel much better and more energetic. This is especially true if your red blood cells have gotten low (anemic). The transfusion raises the level of the red blood cells which carry oxygen, and this usually causes an energy increase.  The nurse administering the transfusion will monitor you carefully for complications. HOME CARE INSTRUCTIONS  No special instructions are needed after a transfusion. You may find your energy is better. Speak with your caregiver about any limitations on activity for underlying diseases you may have. SEEK MEDICAL CARE IF:   Your condition is not improving after your transfusion.  You develop redness or irritation at the intravenous (IV) site. SEEK IMMEDIATE MEDICAL CARE IF:  Any of the following symptoms occur over the next 12 hours:  Shaking chills.  You have a temperature by mouth above 102 F (38.9 C), not controlled by medicine.  Chest, back, or muscle pain.  People around you feel you are not acting correctly or are confused.  Shortness of breath or difficulty breathing.  Dizziness and fainting.  You get a rash or develop hives.  You have a decrease in urine output.  Your urine turns a dark color or changes to pink, red, or brown. Any of the following symptoms occur over the next 10 days:  You have a temperature by mouth above 102 F (38.9 C), not controlled by medicine.  Shortness of breath.  Weakness after normal activity.  The white part of the eye turns yellow (jaundice).  You have a decrease in the amount of urine or are urinating less often.  Your urine turns a dark color or changes to pink, red, or brown. Document Released: 01/08/2000 Document Revised: 04/04/2011 Document Reviewed: 08/27/2007 [facility] Patient Information 2014 [place], [place].  _______________________________________________________________________

## 2018-12-25 ENCOUNTER — Inpatient Hospital Stay (HOSPITAL_COMMUNITY)
Admission: EM | Admit: 2018-12-25 | Discharge: 2018-12-29 | DRG: 669 | Disposition: A | Discharge status: Skilled Nursing Facility | Payer: Medicare Other | Attending: Internal Medicine | Admitting: Internal Medicine

## 2018-12-25 ENCOUNTER — Other Ambulatory Visit: Payer: Self-pay

## 2018-12-25 ENCOUNTER — Inpatient Hospital Stay (HOSPITAL_COMMUNITY): Admission: RE | Admit: 2018-12-25 | Payer: Medicare Other | Source: Ambulatory Visit

## 2018-12-25 ENCOUNTER — Encounter (HOSPITAL_COMMUNITY)
Admission: RE | Admit: 2018-12-25 | Discharge: 2018-12-25 | Disposition: A | Discharge status: Home / Self Care | Payer: Medicare Other | Source: Ambulatory Visit | Attending: Family Medicine | Admitting: Family Medicine

## 2018-12-25 ENCOUNTER — Other Ambulatory Visit (HOSPITAL_COMMUNITY): Payer: Medicare Other

## 2018-12-25 ENCOUNTER — Emergency Department (HOSPITAL_COMMUNITY): Payer: Medicare Other

## 2018-12-25 ENCOUNTER — Encounter (HOSPITAL_COMMUNITY): Payer: Self-pay

## 2018-12-25 DIAGNOSIS — C679 Malignant neoplasm of bladder, unspecified: Secondary | ICD-10-CM | POA: Diagnosis present

## 2018-12-25 DIAGNOSIS — Z7289 Other problems related to lifestyle: Secondary | ICD-10-CM

## 2018-12-25 DIAGNOSIS — N179 Acute kidney failure, unspecified: Secondary | ICD-10-CM | POA: Diagnosis present

## 2018-12-25 DIAGNOSIS — N3000 Acute cystitis without hematuria: Secondary | ICD-10-CM

## 2018-12-25 DIAGNOSIS — Z9049 Acquired absence of other specified parts of digestive tract: Secondary | ICD-10-CM

## 2018-12-25 DIAGNOSIS — Z20828 Contact with and (suspected) exposure to other viral communicable diseases: Secondary | ICD-10-CM | POA: Diagnosis present

## 2018-12-25 DIAGNOSIS — Z23 Encounter for immunization: Secondary | ICD-10-CM | POA: Diagnosis present

## 2018-12-25 DIAGNOSIS — N390 Urinary tract infection, site not specified: Secondary | ICD-10-CM | POA: Diagnosis not present

## 2018-12-25 DIAGNOSIS — N9989 Other postprocedural complications and disorders of genitourinary system: Secondary | ICD-10-CM | POA: Diagnosis present

## 2018-12-25 DIAGNOSIS — I129 Hypertensive chronic kidney disease with stage 1 through stage 4 chronic kidney disease, or unspecified chronic kidney disease: Secondary | ICD-10-CM | POA: Diagnosis present

## 2018-12-25 DIAGNOSIS — N189 Chronic kidney disease, unspecified: Secondary | ICD-10-CM

## 2018-12-25 DIAGNOSIS — E860 Dehydration: Secondary | ICD-10-CM | POA: Diagnosis present

## 2018-12-25 DIAGNOSIS — B964 Proteus (mirabilis) (morganii) as the cause of diseases classified elsewhere: Secondary | ICD-10-CM | POA: Diagnosis present

## 2018-12-25 DIAGNOSIS — Z79899 Other long term (current) drug therapy: Secondary | ICD-10-CM | POA: Diagnosis not present

## 2018-12-25 DIAGNOSIS — N1832 Chronic kidney disease, stage 3b: Secondary | ICD-10-CM | POA: Diagnosis present

## 2018-12-25 DIAGNOSIS — Z7984 Long term (current) use of oral hypoglycemic drugs: Secondary | ICD-10-CM | POA: Diagnosis not present

## 2018-12-25 DIAGNOSIS — N3001 Acute cystitis with hematuria: Secondary | ICD-10-CM | POA: Diagnosis present

## 2018-12-25 DIAGNOSIS — E869 Volume depletion, unspecified: Secondary | ICD-10-CM

## 2018-12-25 DIAGNOSIS — Z87891 Personal history of nicotine dependence: Secondary | ICD-10-CM

## 2018-12-25 DIAGNOSIS — E876 Hypokalemia: Secondary | ICD-10-CM | POA: Diagnosis present

## 2018-12-25 DIAGNOSIS — E1122 Type 2 diabetes mellitus with diabetic chronic kidney disease: Secondary | ICD-10-CM | POA: Diagnosis present

## 2018-12-25 DIAGNOSIS — R32 Unspecified urinary incontinence: Secondary | ICD-10-CM | POA: Diagnosis present

## 2018-12-25 DIAGNOSIS — R627 Adult failure to thrive: Secondary | ICD-10-CM | POA: Diagnosis present

## 2018-12-25 LAB — CBC WITH DIFFERENTIAL/PLATELET
Abs Immature Granulocytes: 0.29 10*3/uL — ABNORMAL HIGH (ref 0.00–0.07)
Basophils Absolute: 0 10*3/uL (ref 0.0–0.1)
Basophils Relative: 0 %
Eosinophils Absolute: 0 10*3/uL (ref 0.0–0.5)
Eosinophils Relative: 0 %
HCT: 33.3 % — ABNORMAL LOW (ref 39.0–52.0)
Hemoglobin: 10.7 g/dL — ABNORMAL LOW (ref 13.0–17.0)
Immature Granulocytes: 2 %
Lymphocytes Relative: 9 %
Lymphs Abs: 1.4 10*3/uL (ref 0.7–4.0)
MCH: 30.3 pg (ref 26.0–34.0)
MCHC: 32.1 g/dL (ref 30.0–36.0)
MCV: 94.3 fL (ref 80.0–100.0)
Monocytes Absolute: 1.2 10*3/uL — ABNORMAL HIGH (ref 0.1–1.0)
Monocytes Relative: 7 %
Neutro Abs: 12.9 10*3/uL — ABNORMAL HIGH (ref 1.7–7.7)
Neutrophils Relative %: 82 %
Platelets: 492 10*3/uL — ABNORMAL HIGH (ref 150–400)
RBC: 3.53 MIL/uL — ABNORMAL LOW (ref 4.22–5.81)
RDW: 17.3 % — ABNORMAL HIGH (ref 11.5–15.5)
WBC: 15.8 10*3/uL — ABNORMAL HIGH (ref 4.0–10.5)
nRBC: 0 % (ref 0.0–0.2)

## 2018-12-25 LAB — CBC
HCT: 29.5 % — ABNORMAL LOW (ref 39.0–52.0)
Hemoglobin: 9.5 g/dL — ABNORMAL LOW (ref 13.0–17.0)
MCH: 30.6 pg (ref 26.0–34.0)
MCHC: 32.2 g/dL (ref 30.0–36.0)
MCV: 95.2 fL (ref 80.0–100.0)
Platelets: 443 10*3/uL — ABNORMAL HIGH (ref 150–400)
RBC: 3.1 MIL/uL — ABNORMAL LOW (ref 4.22–5.81)
RDW: 17.3 % — ABNORMAL HIGH (ref 11.5–15.5)
WBC: 13.9 10*3/uL — ABNORMAL HIGH (ref 4.0–10.5)
nRBC: 0 % (ref 0.0–0.2)

## 2018-12-25 LAB — CK: Total CK: 33 U/L — ABNORMAL LOW (ref 49–397)

## 2018-12-25 LAB — URINALYSIS, ROUTINE W REFLEX MICROSCOPIC
Bilirubin Urine: NEGATIVE
Glucose, UA: NEGATIVE mg/dL
Ketones, ur: 5 mg/dL — AB
Nitrite: NEGATIVE
Protein, ur: >=300 mg/dL — AB
RBC / HPF: >50 RBC/hpf — ABNORMAL HIGH (ref 0–5)
Specific Gravity, Urine: 1.017 (ref 1.005–1.030)
WBC, UA: >50 WBC/hpf — ABNORMAL HIGH (ref 0–5)
pH: 8 (ref 5.0–8.0)

## 2018-12-25 LAB — COMPREHENSIVE METABOLIC PANEL
ALT: 31 U/L (ref 0–44)
AST: 25 U/L (ref 15–41)
Albumin: 2.7 g/dL — ABNORMAL LOW (ref 3.5–5.0)
Alkaline Phosphatase: 123 U/L (ref 38–126)
Anion gap: 16 — ABNORMAL HIGH (ref 5–15)
BUN: 62 mg/dL — ABNORMAL HIGH (ref 8–23)
CO2: 25 mmol/L (ref 22–32)
Calcium: 8.8 mg/dL — ABNORMAL LOW (ref 8.9–10.3)
Chloride: 93 mmol/L — ABNORMAL LOW (ref 98–111)
Creatinine, Ser: 2.48 mg/dL — ABNORMAL HIGH (ref 0.61–1.24)
GFR calc Af Amer: 28 mL/min — ABNORMAL LOW (ref >60)
GFR calc non Af Amer: 24 mL/min — ABNORMAL LOW (ref >60)
Glucose, Bld: 224 mg/dL — ABNORMAL HIGH (ref 70–99)
Potassium: 2.7 mmol/L — CL (ref 3.5–5.1)
Sodium: 134 mmol/L — ABNORMAL LOW (ref 135–145)
Total Bilirubin: 2.6 mg/dL — ABNORMAL HIGH (ref 0.3–1.2)
Total Protein: 7.4 g/dL (ref 6.5–8.1)

## 2018-12-25 LAB — TSH: TSH: 2.639 u[IU]/mL (ref 0.350–4.500)

## 2018-12-25 LAB — BRAIN NATRIURETIC PEPTIDE: B Natriuretic Peptide: 72 pg/mL (ref 0.0–100.0)

## 2018-12-25 MED ORDER — FLUTICASONE PROPIONATE 50 MCG/ACT NA SUSP
2.0000 | Freq: Every day | NASAL | Status: DC | PRN
  Filled 2018-12-25: qty 16

## 2018-12-25 MED ORDER — POLYVINYL ALCOHOL 1.4 % OP SOLN
1.0000 [drp] | Freq: Three times a day (TID) | OPHTHALMIC | Status: DC | PRN
  Filled 2018-12-25: qty 15

## 2018-12-25 MED ORDER — OXYBUTYNIN CHLORIDE 5 MG PO TABS
5.0000 mg | ORAL_TABLET | Freq: Three times a day (TID) | ORAL | Status: DC | PRN
  Administered 2018-12-26: 5 mg via ORAL
  Filled 2018-12-25: qty 1

## 2018-12-25 MED ORDER — SODIUM CHLORIDE 0.9 % IV SOLN
1.0000 g | Freq: Once | INTRAVENOUS | Status: AC
  Administered 2018-12-25: 1 g via INTRAVENOUS
  Filled 2018-12-25: qty 10

## 2018-12-25 MED ORDER — SODIUM CHLORIDE 0.9 % IV BOLUS
1000.0000 mL | Freq: Once | INTRAVENOUS | Status: AC
  Administered 2018-12-25: 1000 mL via INTRAVENOUS

## 2018-12-25 MED ORDER — POTASSIUM CHLORIDE CRYS ER 20 MEQ PO TBCR
40.0000 meq | EXTENDED_RELEASE_TABLET | Freq: Once | ORAL | Status: AC
  Administered 2018-12-25: 40 meq via ORAL
  Filled 2018-12-25: qty 2

## 2018-12-25 MED ORDER — INSULIN ASPART 100 UNIT/ML ~~LOC~~ SOLN
0.0000 [IU] | Freq: Every day | SUBCUTANEOUS | Status: DC
  Administered 2018-12-28: 2 [IU] via SUBCUTANEOUS

## 2018-12-25 MED ORDER — CITALOPRAM HYDROBROMIDE 20 MG PO TABS
20.0000 mg | ORAL_TABLET | Freq: Every day | ORAL | Status: DC
  Administered 2018-12-26: 20 mg via ORAL
  Filled 2018-12-25: qty 1

## 2018-12-25 MED ORDER — POTASSIUM CHLORIDE 10 MEQ/100ML IV SOLN
10.0000 meq | INTRAVENOUS | Status: AC
  Administered 2018-12-25 (×2): 10 meq via INTRAVENOUS
  Filled 2018-12-25 (×2): qty 100

## 2018-12-25 MED ORDER — SODIUM CHLORIDE 0.9 % IV SOLN
INTRAVENOUS | Status: DC
  Administered 2018-12-26 – 2018-12-27 (×4): via INTRAVENOUS

## 2018-12-25 MED ORDER — SODIUM CHLORIDE 0.9 % IV SOLN
1.0000 g | INTRAVENOUS | Status: DC
  Administered 2018-12-26 – 2018-12-28 (×4): 1 g via INTRAVENOUS
  Filled 2018-12-25: qty 10
  Filled 2018-12-25 (×3): qty 1

## 2018-12-25 MED ORDER — SODIUM CHLORIDE 0.9 % IV SOLN
INTRAVENOUS | Status: DC
  Administered 2018-12-25: 19:00:00 via INTRAVENOUS

## 2018-12-25 MED ORDER — INSULIN ASPART 100 UNIT/ML ~~LOC~~ SOLN
0.0000 [IU] | Freq: Three times a day (TID) | SUBCUTANEOUS | Status: DC
  Administered 2018-12-26: 1 [IU] via SUBCUTANEOUS
  Administered 2018-12-26: 2 [IU] via SUBCUTANEOUS
  Administered 2018-12-27: 1 [IU] via SUBCUTANEOUS
  Administered 2018-12-27: 2 [IU] via SUBCUTANEOUS
  Administered 2018-12-28 – 2018-12-29 (×3): 1 [IU] via SUBCUTANEOUS
  Administered 2018-12-29: 2 [IU] via SUBCUTANEOUS

## 2018-12-25 MED ORDER — POTASSIUM CHLORIDE CRYS ER 20 MEQ PO TBCR
40.0000 meq | EXTENDED_RELEASE_TABLET | ORAL | Status: AC
  Administered 2018-12-26 (×2): 40 meq via ORAL
  Filled 2018-12-25 (×2): qty 2

## 2018-12-25 MED ORDER — HEPARIN SODIUM (PORCINE) 5000 UNIT/ML IJ SOLN
5000.0000 [IU] | Freq: Three times a day (TID) | INTRAMUSCULAR | Status: DC
  Administered 2018-12-26 – 2018-12-29 (×9): 5000 [IU] via SUBCUTANEOUS
  Filled 2018-12-25 (×9): qty 1

## 2018-12-25 NOTE — ED Provider Notes (Addendum)
[facility] DEPT Provider Note   CSN: [id number] Arrival date & time: 12/25/18  1430     History   Chief Complaint Chief Complaint  Patient presents with  . Failure To Thrive  . Dysuria    HPI [name]. is a 73 y.o. male.     HPI Patient presents the emergency room for evaluation of weakness.  Patient states he started having trouble with hematuria over the last month or so.  Patient was seen in the emergency room on October 27.  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  Patient was referred to urology.  He had a transurethral resection of the bladder tumor on November 13.  Patient states he has never recovered since that event.  He has continued to have difficulty with urinating.  He continues to go to the bathroom frequently at night which is making it hard for him to sleep.  He has progressively been getting weaker and weaker.  Patient has been sitting in a chair.  He says for the last week or so he has not been able to get up out of the chair.  He has not been eating.  Friend has been bringing him some water so he has been drinking.  Patient called EMS today.  He was found covered in feces and urine. Past Medical History:  Diagnosis Date  . Hypertension   . Pneumonia    as a child  . Pre-diabetes     Patient Active Problem List   Diagnosis Date Noted  . 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 CHOLECYSTECTOMY  2003  . FRACTURE SURGERY  1996   skull fx  . TRANSURETHRAL RESECTION OF BLADDER TUMOR N/A 12/07/2018   Procedure: TRANSURETHRAL RESECTION OF BLADDER TUMOR (TURBT);  Surgeon: Lucas Mallow, MD;  Location: [facility] OR[facility];  Service: Urology;  Laterality: N/A;        Home Medications    Prior to Admission medications   Medication Sig Start Date End Date Taking? Authorizing Provider  citalopram (CELEXA) 20 MG tablet Take 20 mg by mouth daily.  04/29/15   Yes [provider]  fluticasone (FLONASE) 50 MCG/ACT nasal spray Place 2 sprays into both nostrils daily as needed for allergies. 11/24/17  Yes [provider]  hydroxypropyl methylcellulose / hypromellose (ISOPTO TEARS / GONIOVISC) 2.5 % ophthalmic solution Place 1 drop into both eyes 3 (three) times daily as needed for dry eyes.   Yes [provider]  lisinopril-hydrochlorothiazide (PRINZIDE,ZESTORETIC) 10-12.5 MG tablet Take 1 tablet by mouth daily.  04/29/15  Yes [provider]  metFORMIN (GLUCOPHAGE-XR) 500 MG 24 hr tablet Take 500 mg by mouth daily with breakfast.   Yes [provider]  oxybutynin (DITROPAN) 5 MG tablet Take 1 tablet (5 mg total) by mouth every 8 (eight) hours as needed for bladder spasms. 12/08/18  Yes Dahlstedt, Annie Main, MD  HYDROcodone-acetaminophen (NORCO) 5-325 MG tablet Take 1 tablet by mouth every 4 (four) hours as needed for moderate pain. Patient not taking: Reported on 12/19/2018 12/07/18   Lucas Mallow, MD    Family History History reviewed. No pertinent family history.  Social History Social History   Tobacco Use  . Smoking status: Former Smoker    Packs/day: 1.00    Years: 15.00    Pack years: 15.00    Types: Cigarettes    Quit date: 12/05/1977  Years since quitting: 41.0  . Smokeless tobacco: Never Used  Substance Use Topics  . Alcohol use: Yes  . Drug use: Not Currently     Allergies   Patient has no known allergies.   Review of Systems Review of Systems  Constitutional: Positive for appetite change.  HENT: Negative for mouth sores.   Eyes: Negative for visual disturbance.  Respiratory: Negative for cough and shortness of breath.   Cardiovascular: Negative for chest pain.  Gastrointestinal: Negative for abdominal pain, nausea and vomiting.  Genitourinary: Positive for dysuria.  Neurological: Positive for weakness.  All other systems reviewed and are negative.    Physical Exam  Updated Vital Signs BP 138/69   Pulse 94   Temp (!) 97.5 F (36.4 C) (Oral)   Resp (!) 22   SpO2 100%   Physical Exam Vitals signs and nursing note reviewed.  Constitutional:      General: He is not in acute distress.    Appearance: He is well-developed. He is ill-appearing.  HENT:     Head: Normocephalic.     Comments: Remote appearing facial scars, facial asymmetry    Right Ear: External ear normal.     Left Ear: External ear normal.  Eyes:     General: No scleral icterus.       Right eye: No discharge.        Left eye: No discharge.     Conjunctiva/sclera: Conjunctivae normal.  Neck:     Musculoskeletal: Neck supple.     Trachea: No tracheal deviation.  Cardiovascular:     Rate and Rhythm: Normal rate and regular rhythm.  Pulmonary:     Effort: Pulmonary effort is normal. No respiratory distress.     Breath sounds: Normal breath sounds. No stridor. No wheezing or rales.  Abdominal:     General: Bowel sounds are normal. There is no distension.     Palpations: Abdomen is soft.     Tenderness: There is no abdominal tenderness. There is no guarding or rebound.  Musculoskeletal:        General: No tenderness.  Skin:    General: Skin is warm and dry.     Findings: No rash.  Neurological:     Mental Status: He is alert.     Cranial Nerves: No cranial nerve deficit ( , extraocular movements intact, no slurred speech ).     Sensory: No sensory deficit.     Motor: Weakness present. No abnormal muscle tone or seizure activity.     Coordination: Coordination normal.     Comments: Right-sided facial droop, generalized weakness, able to move all extremities      ED Treatments / Results  Labs (all labs ordered are listed, but only abnormal results are displayed) Labs Reviewed  COMPREHENSIVE METABOLIC PANEL - Abnormal; Notable for the following components:      Result Value   Sodium 134 (*)    Potassium 2.7 (*)    Chloride 93 (*)    Glucose, Bld 224 (*)    BUN 62 (*)     Creatinine, Ser 2.48 (*)    Calcium 8.8 (*)    Albumin 2.7 (*)    Total Bilirubin 2.6 (*)    GFR calc non Af Amer 24 (*)    GFR calc Af Amer 28 (*)    Anion gap 16 (*)    All other components within normal limits  CBC WITH DIFFERENTIAL/PLATELET - Abnormal; Notable for the following components:   WBC 15.8 (*)  RBC 3.53 (*)    Hemoglobin 10.7 (*)    HCT 33.3 (*)    RDW 17.3 (*)    Platelets 492 (*)    Neutro Abs 12.9 (*)    Monocytes Absolute 1.2 (*)    Abs Immature Granulocytes 0.29 (*)    All other components within normal limits  URINALYSIS, ROUTINE W REFLEX MICROSCOPIC - Abnormal; Notable for the following components:   Color, Urine BROWN (*)    APPearance TURBID (*)    Hgb urine dipstick SMALL (*)    Ketones, ur 5 (*)    Protein, ur >=300 (*)    Leukocytes,Ua MODERATE (*)    RBC / HPF >50 (*)    WBC, UA >50 (*)    Bacteria, UA MANY (*)    All other components within normal limits  CK - Abnormal; Notable for the following components:   Total CK 33 (*)    All other components within normal limits  URINE CULTURE  SARS CORONAVIRUS 2 (TAT 6-24 HRS)    EKG EKG Interpretation  Date/Time:  Tuesday December 25 2018 16:12:22 EST Ventricular Rate:  105 PR Interval:    QRS Duration: 88 QT Interval:  401 QTC Calculation: 530 R Axis:   24 Text Interpretation: Sinus tachycardia Low voltage, precordial leads Nonspecific T abnormalities, inferior leads Prolonged QT interval Since last tracing rate faster Confirmed by Dorie Rank [phone]) on 12/25/2018 5:25:52 PM   Radiology Dg Chest Portable 1 View  Result Date: 12/25/2018 CLINICAL DATA:  73 year old male with weakness. EXAM: PORTABLE CHEST 1 VIEW COMPARISON:  None. FINDINGS: The heart size and mediastinal contours are within normal limits. Both lungs are clear. The visualized skeletal structures are unremarkable. IMPRESSION: No active disease. Electronically Signed   By: Anner Crete M.D.   On: 12/25/2018 16:01     Procedures .Critical Care Performed by: Dorie Rank, MD Authorized by: Dorie Rank, MD   Critical care provider statement:    Critical care time (minutes):  40   Critical care was time spent personally by me on the following activities:  Discussions with consultants, evaluation of patient's response to treatment, examination of patient, ordering and performing treatments and interventions, ordering and review of laboratory studies, ordering and review of radiographic studies, pulse oximetry, re-evaluation of patient's condition, obtaining history from patient or surrogate and review of old charts   (including critical care time)  Medications Ordered in ED Medications  sodium chloride 0.9 % bolus 1,000 mL (1,000 mLs Intravenous Bolus 12/25/18 1543)    And  0.9 %  sodium chloride infusion ( Intravenous New Bag/Given 12/25/18 1835)  potassium chloride 10 mEq in 100 mL IVPB (0 mEq Intravenous Stopped 12/25/18 1938)  cefTRIAXone (ROCEPHIN) 1 g in sodium chloride 0.9 % 100 mL IVPB (has no administration in time range)  potassium chloride SA (KLOR-CON) CR tablet 40 mEq (40 mEq Oral Given 12/25/18 1838)     Initial Impression / Assessment and Plan / ED Course  I have reviewed the triage vital signs and the nursing notes.  Pertinent labs & imaging results that were available during my care of the patient were reviewed by me and considered in my medical decision making (see chart for details).  Clinical Course as of Dec 24 1957  Tue Dec 25, 2018  1724 Labs reviewed.  Hemoglobin is improved from previous.  Leukocytosis is new compared to previous labs.   [JK]  [id number] Metabolic panel is notable for acute kidney injury as well as hypokalemia.   [  JK]  1957 UA is consistent with urinary tract infection.   [JK]    Clinical Course User Index [JK] Dorie Rank, MD     Patient's work-up is notable for an acute kidney injury associated with dehydration and urinary tract infection.  Patient has electrolyte  abnormalities notable for hypokalemia and hypochloremia.  No signs of sepsis.  Weakness is improving with treatment.  Patient has been treated with IV fluids.  IV antibiotics ordered.  Plan admission to the hospital for further treatment.   Final Clinical Impressions(s) / ED Diagnoses   Final diagnoses:  AKI (acute kidney injury) ([place])  Dehydration  Hypokalemia  Acute cystitis without hematuria        Dorie Rank, MD 12/25/18 2002

## 2018-12-25 NOTE — ED Notes (Signed)
Date and time results received: 12/25/18 4:48 PM  (use smartphrase ".now" to insert current time)  Test: Potassium Critical Value: 2.7  Name of Provider Notified: Dr Tomi Bamberger  Orders Received? Or Actions Taken?:

## 2018-12-25 NOTE — ED Triage Notes (Signed)
Pt arrives via [facility] from home. Pt reports weakness and not being able to get up from his chair in days. Pt was covered in urine and feces. Pt reports dysuria "for days" Pt denies fevers. Pt denies covid exposure. Pt had a biopsy 2 weeks ago and dx with bladder cancer.

## 2018-12-25 NOTE — H&P (Signed)
History and Physical  [name]. [id number] DOB: 1945-05-19 DOA: 12/25/2018   Referring physician: Provider PCP: Katherina Mires, MD  Outpatient Specialists: Urology team Patient coming from: Home  Chief Complaint: Failure to thrive.  HPI:  Patient is a 73 year old Caucasian male, very poor historian, who underwent transurethral resection of bladder tumor that was found to be malignant (high-grade papillary urothelial carcinoma) on 07 December 2018.  Other past medical history includes chronic hypokalemia, chronic kidney disease stage III, prediabetes mellitus and hypertension.  Postop, patient has been failing to thrive.  Patient is reported to be very weak and unable to get up from a sitting position, poor p.o. intake, dizzy, falling, urinating and defecating on oneself with associated dysuria.  EMS found patient covered in urine and feces.  Apparently, patient's friend called EMS for the patient.  On presentation to the hospital, patient was found to have WBC of 15.8, hemoglobin of 10.7, platelet count of 183, potassium of 2.7, BUN of 62 (up from 17 about 2 weeks ago), serum creatinine of 2.48 (up from 1.24 per 2 weeks ago), albumin of 2.7, total bilirubin of 2.6, with urinalysis revealing moderate leukocyte, 5 ketone, h4.017, protein of greater than 300, many bacteria, RBC and WBC of greater than 50 with amorphous crystals.  EKG revealed QTC of 530 ms.  Patient will be admitted for further assessment and management.  ED Course: On presentation to the hospital, temperature was 97.7, blood pressure of 159/95, heart rate of 89 with respiratory rate of 20.  O2 sat is 100% on room air.  Pertinent labs is as documented above.  Patient has been aggressively hydrated.  Abnormal electrolytes have been corrected.  Urine has been sent for culture, and IV Rocephin prescribed.  Pertinent labs: As documented above.  EKG: Independently reviewed.   Imaging: independently reviewed.   Review of  Systems: Patient is a poor historian.  Past Medical History:  Diagnosis Date  . Hypertension   . Pneumonia    as a child  . Pre-diabetes     Past Surgical History:  Procedure Laterality Date  . CHOLECYSTECTOMY  2003  . FRACTURE SURGERY  1996   skull fx  . TRANSURETHRAL RESECTION OF BLADDER TUMOR N/A 12/07/2018   Procedure: TRANSURETHRAL RESECTION OF BLADDER TUMOR (TURBT);  Surgeon: Lucas Mallow, MD;  Location: [facility];  Service: Urology;  Laterality: N/A;     reports that he quit smoking about 41 years ago. His smoking use included cigarettes. He has a 15.00 pack-year smoking history. He has never used smokeless tobacco. He reports current alcohol use. He reports previous drug use.  No Known Allergies  History reviewed. No pertinent family history.   Prior to Admission medications   Medication Sig Start Date End Date Taking? Authorizing Provider  citalopram (CELEXA) 20 MG tablet Take 20 mg by mouth daily.  04/29/15  Yes [provider]  fluticasone (FLONASE) 50 MCG/ACT nasal spray Place 2 sprays into both nostrils daily as needed for allergies. 11/24/17  Yes [provider]  hydroxypropyl methylcellulose / hypromellose (ISOPTO TEARS / GONIOVISC) 2.5 % ophthalmic solution Place 1 drop into both eyes 3 (three) times daily as needed for dry eyes.   Yes [provider]  lisinopril-hydrochlorothiazide (PRINZIDE,ZESTORETIC) 10-12.5 MG tablet Take 1 tablet by mouth daily.  04/29/15  Yes [provider]  metFORMIN (GLUCOPHAGE-XR) 500 MG 24 hr tablet Take 500 mg by mouth daily with breakfast.   Yes [provider]  oxybutynin (DITROPAN)  5 MG tablet Take 1 tablet (5 mg total) by mouth every 8 (eight) hours as needed for bladder spasms. 12/08/18  Yes Dahlstedt, Annie Main, MD  HYDROcodone-acetaminophen (NORCO) 5-325 MG tablet Take 1 tablet by mouth every 4 (four) hours as needed for moderate pain. Patient not taking: Reported on 12/19/2018 12/07/18    Lucas Mallow, MD    Physical Exam: Vitals:   12/25/18 1503 12/25/18 1621 12/25/18 1730 12/25/18 1842  BP: 116/66 (!) 116/58 116/89 138/69  Pulse: (!) 104 88 92 94  Resp: 16 15 (!) 24 (!) 22  Temp: (!) 97.5 F (36.4 C)     TempSrc: Oral     SpO2: 98% 98% 100% 100%    Constitutional:  . Appears calm and comfortable Eyes:  . No pallor. No jaundice.  ENMT:  . external ears, nose appear normal Neck:  . Neck is supple. No JVD Respiratory:  . CTA bilaterally, no w/r/r.  . Respiratory effort normal. No retractions or accessory muscle use Cardiovascular:  . S1S2 . No LE extremity edema   Abdomen:  . Abdomen is soft and non tender. Organs are difficult to assess. Neurologic:  . Awake and alert. . Moves all limbs.  Wt Readings from Last 3 Encounters:  12/06/18 108.4 kg    I have personally reviewed following labs and imaging studies  Labs on Admission:  CBC: Recent Labs  Lab 12/25/18 1513  WBC 15.8*  NEUTROABS 12.9*  HGB 10.7*  HCT 33.3*  MCV 94.3  PLT [id number]*   Basic Metabolic Panel: Recent Labs  Lab 12/25/18 1513  NA 134*  K 2.7*  CL 93*  CO2 25  GLUCOSE 224*  BUN 62*  CREATININE 2.48*  CALCIUM 8.8*   Liver Function Tests: Recent Labs  Lab 12/25/18 1513  AST 25  ALT 31  ALKPHOS 123  BILITOT 2.6*  PROT 7.4  ALBUMIN 2.7*   No results for input(s): LIPASE, AMYLASE in the last 168 hours. No results for input(s): AMMONIA in the last 168 hours. Coagulation Profile: No results for input(s): INR, PROTIME in the last 168 hours. Cardiac Enzymes: Recent Labs  Lab 12/25/18 1513  CKTOTAL 33*   BNP (last 3 results) No results for input(s): PROBNP in the last 8760 hours. HbA1C: No results for input(s): HGBA1C in the last 72 hours. CBG: No results for input(s): GLUCAP in the last 168 hours. Lipid Profile: No results for input(s): CHOL, HDL, LDLCALC, TRIG, CHOLHDL, LDLDIRECT in the last 72 hours. Thyroid Function Tests: No results for  input(s): TSH, T4TOTAL, FREET4, T3FREE, THYROIDAB in the last 72 hours. Anemia Panel: No results for input(s): VITAMINB12, FOLATE, FERRITIN, TIBC, IRON, RETICCTPCT in the last 72 hours. Urine analysis:    Component Value Date/Time   COLORURINE BROWN (A) 12/25/2018 1513   APPEARANCEUR TURBID (A) 12/25/2018 1513   LABSPEC 1.017 12/25/2018 1513   PHURINE 8.0 12/25/2018 1513   GLUCOSEU NEGATIVE 12/25/2018 1513   HGBUR SMALL (A) 12/25/2018 1513   BILIRUBINUR NEGATIVE 12/25/2018 1513   KETONESUR 5 (A) 12/25/2018 1513   PROTEINUR >=300 (A) 12/25/2018 1513   NITRITE NEGATIVE 12/25/2018 1513   LEUKOCYTESUR MODERATE (A) 12/25/2018 1513   Sepsis Labs: @LABRCNTIP (procalcitonin:4,lacticidven:4) )No results found for this or any previous visit (from the past 240 hour(s)).    Radiological Exams on Admission: Dg Chest Portable 1 View  Result Date: 12/25/2018 CLINICAL DATA:  73 year old male with weakness. EXAM: PORTABLE CHEST 1 VIEW COMPARISON:  None. FINDINGS: The heart size and mediastinal contours are  within normal limits. Both lungs are clear. The visualized skeletal structures are unremarkable. IMPRESSION: No active disease. Electronically Signed   By: Anner Crete M.D.   On: 12/25/2018 16:01    EKG: Independently reviewed.   Active Problems:   FTT (failure to thrive) in adult   Assessment/Plan Failure to thrive/poor p.o. intake: -This is likely multifactorial. -Patient has undergone recent surgery. -Patient has poor p.o. intake, since malnourished and volume depleted. -Hydrate patient. -Caloric count. -Further management depend on hospital course.  Likely UTI: Follow urine culture. -Continue IV Rocephin.  Acute kidney injury on CKD 3/volume depletion: -Acute kidney injury is likely prerenal. -Continue to hydrate patient. -AKI work-up. -Proteinuria is also noted. -We will check UPC. -Renal ultrasound -Further management depend on hospital course.  Hypokalemia: -This  is chronic. -Patient was on HCTZ prior to admission. -Discontinue his CTV. -Monitor and replete magnesium. -Continue to assess for possible etiology of the hypokalemia.   Plan QTc interval: Likely secondary to electrolyte abnormalities. Replete potassium Repeat EKG in the morning  DVT prophylaxis: Subcutaneous heparin Code Status: Full code Family Communication:  Disposition Plan: This will depend on hospital course Consults called: Low threshold to consult urology Admission status: Inpatient  Time spent: 65 minutes  Dana Allan, MD  [facility] Pager #: [phone] 7PM-7AM contact night coverage as above  12/25/2018, 8:26 PM

## 2018-12-25 NOTE — Progress Notes (Signed)
RN called patient cell number to ascertain his whereabouts as he was supposed to be checked in for a 1 pm PAT appt today. Patient answered phone and stated that he was at home and was not aware of any pre-op appt. RN asked patient if he would be able to come for a later appt at  2pm and to do his covid test today. At this time , a lady began speaking and identified herself as patient's friend Manuela Schwartz. Manuela Schwartz reports that patient is very weak, using the bathroom on himself, not eating, and dizzy and falling and that the only was could get the patient out of the house to the appointment is if she called EMS to bring him. Manuela Schwartz also reports that she called [facility] this past Friday and spoke with a nurse there about the patient's worsening condition and was advised by that nurse to take the patient to the emergency room. Per Manuela Schwartz , the patient declined to go to the emergency room. RN consulted with PA about the above. Per PA advisory, RN advised Manuela Schwartz to immediately call [facility] to make them aware of patient's condition and to seek emergency medical help as necessary . Manuela Schwartz verbalized understanding.    RN then called and spoke with Darrel Reach (surgery scheduler for Dr. Gloriann Loan) and explained the above situation. Pam reports she is able to see the  documentation about the call from the patient and Manuela Schwartz and that the message was routed to Dr. Gloriann Loan, however there was no follow-up by surgeon. Pam to f/u with patient .

## 2018-12-25 NOTE — ED Notes (Signed)
Pt given bed bath. Pts feces cleaned off his body.

## 2018-12-26 ENCOUNTER — Inpatient Hospital Stay (HOSPITAL_COMMUNITY): Payer: Medicare Other

## 2018-12-26 DIAGNOSIS — R627 Adult failure to thrive: Secondary | ICD-10-CM

## 2018-12-26 LAB — CBC
HCT: 29.5 % — ABNORMAL LOW (ref 39.0–52.0)
Hemoglobin: 9.3 g/dL — ABNORMAL LOW (ref 13.0–17.0)
MCH: 29.9 pg (ref 26.0–34.0)
MCHC: 31.5 g/dL (ref 30.0–36.0)
MCV: 94.9 fL (ref 80.0–100.0)
Platelets: 416 10*3/uL — ABNORMAL HIGH (ref 150–400)
RBC: 3.11 MIL/uL — ABNORMAL LOW (ref 4.22–5.81)
RDW: 17.2 % — ABNORMAL HIGH (ref 11.5–15.5)
WBC: 13.4 10*3/uL — ABNORMAL HIGH (ref 4.0–10.5)
nRBC: 0 % (ref 0.0–0.2)

## 2018-12-26 LAB — GLUCOSE, CAPILLARY
Glucose-Capillary: 152 mg/dL — ABNORMAL HIGH (ref 70–99)
Glucose-Capillary: 128 mg/dL — ABNORMAL HIGH (ref 70–99)
Glucose-Capillary: 109 mg/dL — ABNORMAL HIGH (ref 70–99)
Glucose-Capillary: 118 mg/dL — ABNORMAL HIGH (ref 70–99)

## 2018-12-26 LAB — BASIC METABOLIC PANEL
Anion gap: 13 (ref 5–15)
Anion gap: 11 (ref 5–15)
BUN: 57 mg/dL — ABNORMAL HIGH (ref 8–23)
BUN: 56 mg/dL — ABNORMAL HIGH (ref 8–23)
CO2: 25 mmol/L (ref 22–32)
CO2: 23 mmol/L (ref 22–32)
Calcium: 8 mg/dL — ABNORMAL LOW (ref 8.9–10.3)
Calcium: 8 mg/dL — ABNORMAL LOW (ref 8.9–10.3)
Chloride: 96 mmol/L — ABNORMAL LOW (ref 98–111)
Chloride: 101 mmol/L (ref 98–111)
Creatinine, Ser: 2.23 mg/dL — ABNORMAL HIGH (ref 0.61–1.24)
Creatinine, Ser: 2.29 mg/dL — ABNORMAL HIGH (ref 0.61–1.24)
GFR calc Af Amer: 32 mL/min — ABNORMAL LOW (ref >60)
GFR calc Af Amer: 31 mL/min — ABNORMAL LOW (ref >60)
GFR calc non Af Amer: 28 mL/min — ABNORMAL LOW (ref >60)
GFR calc non Af Amer: 27 mL/min — ABNORMAL LOW (ref >60)
Glucose, Bld: 164 mg/dL — ABNORMAL HIGH (ref 70–99)
Glucose, Bld: 177 mg/dL — ABNORMAL HIGH (ref 70–99)
Potassium: 3.3 mmol/L — ABNORMAL LOW (ref 3.5–5.1)
Potassium: 3.9 mmol/L (ref 3.5–5.1)
Sodium: 134 mmol/L — ABNORMAL LOW (ref 135–145)
Sodium: 135 mmol/L (ref 135–145)

## 2018-12-26 LAB — SARS CORONAVIRUS 2 (TAT 6-24 HRS): SARS Coronavirus 2: NEGATIVE

## 2018-12-26 LAB — MAGNESIUM
Magnesium: 2.3 mg/dL (ref 1.7–2.4)
Magnesium: 2.3 mg/dL (ref 1.7–2.4)

## 2018-12-26 LAB — PHOSPHORUS: Phosphorus: 2.5 mg/dL (ref 2.5–4.6)

## 2018-12-26 MED ORDER — BOOST / RESOURCE BREEZE PO LIQD CUSTOM
1.0000 | ORAL | Status: DC
  Administered 2018-12-27 – 2018-12-29 (×2): 1 via ORAL

## 2018-12-26 MED ORDER — ADULT MULTIVITAMIN W/MINERALS CH
1.0000 | ORAL_TABLET | Freq: Every day | ORAL | Status: DC
  Administered 2018-12-26 – 2018-12-29 (×3): 1 via ORAL
  Filled 2018-12-26 (×3): qty 1

## 2018-12-26 MED ORDER — INFLUENZA VAC A&B SA ADJ QUAD 0.5 ML IM PRSY
0.5000 mL | PREFILLED_SYRINGE | INTRAMUSCULAR | Status: AC
  Administered 2018-12-26: 0.5 mL via INTRAMUSCULAR
  Filled 2018-12-26: qty 0.5

## 2018-12-26 MED ORDER — LIDOCAINE HCL URETHRAL/MUCOSAL 2 % EX GEL
1.0000 "application " | Freq: Once | CUTANEOUS | Status: DC
  Filled 2018-12-26: qty 5

## 2018-12-26 MED ORDER — CHLORHEXIDINE GLUCONATE CLOTH 2 % EX PADS
6.0000 | MEDICATED_PAD | Freq: Every day | CUTANEOUS | Status: DC
  Administered 2018-12-26 – 2018-12-29 (×3): 6 via TOPICAL

## 2018-12-26 MED ORDER — NYSTATIN 100000 UNIT/GM EX CREA
TOPICAL_CREAM | Freq: Two times a day (BID) | CUTANEOUS | Status: DC
  Administered 2018-12-26 – 2018-12-29 (×8): via TOPICAL
  Filled 2018-12-26: qty 15

## 2018-12-26 MED ORDER — PRO-STAT SUGAR FREE PO LIQD
30.0000 mL | Freq: Every day | ORAL | Status: DC
  Administered 2018-12-26 – 2018-12-27 (×2): 30 mL via ORAL
  Filled 2018-12-26 (×2): qty 30

## 2018-12-26 MED ORDER — ENSURE ENLIVE PO LIQD
237.0000 mL | Freq: Two times a day (BID) | ORAL | Status: DC
  Administered 2018-12-26 – 2018-12-28 (×4): 237 mL via ORAL

## 2018-12-26 NOTE — Evaluation (Signed)
Occupational Therapy Evaluation Patient Details Name: [name]. MRN: [id number] DOB: 03-12-1945 Today's Date: 12/26/2018    History of Present Illness 73 year old man was admitted for FTT.  He recently had TURP for bladder CA and was unable to get up from his chair for days.  PMH:  CKD and HTN   Clinical Impression   Pt was admitted for the above. At baseline, he lives alone.  He states that he is independent and doesn't use an AD.  Pt had been found in chair, from which he couldn't move for days.  He needs up to total A for LB adls and only tolerated SPT with min A. Will follow in acute setting with supervision to min guard level goals    Follow Up Recommendations  SNF    Equipment Recommendations  3 in 1 bedside commode    Recommendations for Other Services       Precautions / Restrictions Precautions Precautions: Fall Restrictions Weight Bearing Restrictions: No      Mobility Bed Mobility Overal bed mobility: Needs Assistance Bed Mobility: Supine to Sit     Supine to sit: Min guard        Transfers Overall transfer level: Needs assistance Equipment used: Rolling walker (2 wheeled) Transfers: Sit to/from [facility] Sit to Stand: Min assist Stand pivot transfers: Min assist       General transfer comment: assist to stand and stabilize. Pt is not used to RW; cues for safety    Balance                                           ADL either performed or assessed with clinical judgement   ADL Overall ADL's : Needs assistance/impaired Eating/Feeding: Set up   Grooming: Set up   Upper Body Bathing: Minimal assistance   Lower Body Bathing: Maximal assistance   Upper Body Dressing : Minimal assistance   Lower Body Dressing: Total assistance   Toilet Transfer: Minimal assistance;Stand-pivot             General ADL Comments: pt with c/o difficulty urinating.  Has catheter at this time     Vision          Perception     Praxis      Pertinent Vitals/Pain Pain Assessment: Faces Faces Pain Scale: Hurts a little bit Pain Location: where catheter is Pain Descriptors / Indicators: Discomfort Pain Intervention(s): Limited activity within patient's tolerance;Monitored during session;Repositioned     Hand Dominance Right   Extremity/Trunk Assessment Upper Extremity Assessment Upper Extremity Assessment: Overall WFL for tasks assessed           Communication Communication Communication: No difficulties   Cognition Arousal/Alertness: Awake/alert Behavior During Therapy: WFL for tasks assessed/performed Overall Cognitive Status: No family/caregiver present to determine baseline cognitive functioning                                 General Comments: decreased initiation with meal set up after saying he wanted to eat; cues for safety with SPT.     General Comments       Exercises     Shoulder Instructions      Home Living Family/patient expects to be discharged to:: Private residence Living Arrangements: Alone Available Help at Discharge: Available PRN/intermittently;Neighbor(friend, who is  his next door neighbor) Type of Home: House Home Access: Stairs to enter [employer] of Steps: 5 Entrance Stairs-Rails: Left Home Layout: One level     Bathroom Shower/Tub: Teacher, early years/pre: Standard     Home Equipment: None          Prior Functioning/Environment Level of Independence: Independent        Comments: per report.  Was unable to get up for days        OT Problem List: Decreased strength;Decreased activity tolerance;Decreased cognition;Decreased safety awareness;Pain      OT Treatment/Interventions: Self-care/ADL training;DME and/or AE instruction;Patient/family education;Therapeutic activities;Balance training;Cognitive remediation/compensation    OT Goals(Current goals can be found in the care plan section) Acute  Rehab OT Goals Patient Stated Goal: none stated OT Goal Formulation: With patient Time For Goal Achievement: 01/09/19 Potential to Achieve Goals: Good ADL Goals Pt Will Perform Grooming: with supervision;standing Pt Will Transfer to Toilet: with min guard assist;ambulating;bedside commode Additional ADL Goal #1: pt will perform adl with set up and min guard using AE as needed Additional ADL Goal #2: pt will initiate activities within 10 seconds and not need any safety cues during therapy session  OT Frequency: Min 2X/week   Barriers to D/C:            Co-evaluation PT/OT/SLP Co-Evaluation/Treatment: Yes Reason for Co-Treatment: For patient/therapist safety PT goals addressed during session: Mobility/safety with mobility OT goals addressed during session: ADL's and self-care      AM-PAC OT "6 Clicks" Daily Activity     Outcome Measure Help from another person eating meals?: A Little Help from another person taking care of personal grooming?: A Little Help from another person toileting, which includes using toliet, bedpan, or urinal?: A Lot Help from another person bathing (including washing, rinsing, drying)?: A Lot Help from another person to put on and taking off regular upper body clothing?: A Little Help from another person to put on and taking off regular lower body clothing?: Total 6 Click Score: 14   End of Session Nurse Communication: Mobility status  Activity Tolerance: Patient limited by fatigue Patient left: in chair;with call bell/phone within reach;with chair alarm set  OT Visit Diagnosis: Muscle weakness (generalized) (M62.81)                Time: 0950-1008 OT Time Calculation (min): 18 min Charges:  OT General Charges $OT Visit: 1 Visit OT Evaluation $OT Eval Low Complexity: [place], OTR/L Acute Rehabilitation Services [phone] [facility] pager [phone] office 12/26/2018  [place] 12/26/2018, 10:39 AM

## 2018-12-26 NOTE — H&P (View-Only) (Signed)
Urology Consult  Consulting MD: Adhikari,Amrit MD  CC: Blood in urine, renal insufficiency, history of bladder cancer  HPI: This is a 73year old male 2 weeks out from transurethral resection of bladder tumor, very large volume.  Final pathology stage T1.  He is readmitted with weakness, poor mobility, poor oral intake, dizziness and incontinence of stool and urine.  In the emergency room serum creatinine was 2.48.  Hemoglobin approximately 10.  He is admitted for medical management, urologic consultation is requested for the patient's gross hematuria and current condition.  He is unaware of any recent fever.  He is not having any specific pain, either in his abdomen or flank/back.  PMH: Past Medical History:  Diagnosis Date  . Hypertension   . Pneumonia    as a child  . Pre-diabetes     PSH: Past Surgical History:  Procedure Laterality Date  . CHOLECYSTECTOMY  2003  . FRACTURE SURGERY  1996   skull fx  . TRANSURETHRAL RESECTION OF BLADDER TUMOR N/A 12/07/2018   Procedure: TRANSURETHRAL RESECTION OF BLADDER TUMOR (TURBT);  Surgeon: Lucas Mallow, MD;  Location: [facility];  Service: Urology;  Laterality: N/A;    Allergies: No Known Allergies  Medications: Medications Prior to Admission  Medication Sig Dispense Refill Last Dose  . citalopram (CELEXA) 20 MG tablet Take 20 mg by mouth daily.    Past Week at Unknown time  . fluticasone (FLONASE) 50 MCG/ACT nasal spray Place 2 sprays into both nostrils daily as needed for allergies.   Past Month at Unknown time  . hydroxypropyl methylcellulose / hypromellose (ISOPTO TEARS / GONIOVISC) 2.5 % ophthalmic solution Place 1 drop into both eyes 3 (three) times daily as needed for dry eyes.   Past Week at Unknown time  . lisinopril-hydrochlorothiazide (PRINZIDE,ZESTORETIC) 10-12.5 MG tablet Take 1 tablet by mouth daily.    Past Week at Unknown time  . metFORMIN (GLUCOPHAGE-XR) 500 MG 24 hr tablet Take 500 mg by mouth daily with breakfast.    Past Week at Unknown time  . oxybutynin (DITROPAN) 5 MG tablet Take 1 tablet (5 mg total) by mouth every 8 (eight) hours as needed for bladder spasms. 15 tablet 0 Past Week at Unknown time  . HYDROcodone-acetaminophen (NORCO) 5-325 MG tablet Take 1 tablet by mouth every 4 (four) hours as needed for moderate pain. (Patient not taking: Reported on 12/19/2018) 10 tablet 0 Not Taking at Unknown time     Social History: Social History   Socioeconomic History  . Marital status: Single    Spouse name: Not on file  . Number of children: Not on file  . Years of education: Not on file  . Highest education level: Not on file  Occupational History  . Not on file  Social Needs  . Financial resource strain: Not on file  . Food insecurity    Worry: Not on file    Inability: Not on file  . Transportation needs    Medical: Not on file    Non-medical: Not on file  Tobacco Use  . Smoking status: Former Smoker    Packs/day: 1.00    Years: 15.00    Pack years: 15.00    Types: Cigarettes    Quit date: 12/05/1977    Years since quitting: 41.0  . Smokeless tobacco: Never Used  Substance and Sexual Activity  . Alcohol use: Yes  . Drug use: Not Currently  . Sexual activity: Not on file  Lifestyle  . Physical activity  Days per week: Not on file    Minutes per session: Not on file  . Stress: Not on file  Relationships  . Social Herbalist on phone: Not on file    Gets together: Not on file    Attends religious service: Not on file    Active member of club or organization: Not on file    Attends meetings of clubs or organizations: Not on file    Relationship status: Not on file  . Intimate partner violence    Fear of current or ex partner: Not on file    Emotionally abused: Not on file    Physically abused: Not on file    Forced sexual activity: Not on file  Other Topics Concern  . Not on file  Social History Narrative  . Not on file    Family History: History  reviewed. No pertinent family history.  Review of Systems: Positive: Urinary incontinence, gross hematuria, mild dysuria. Negative: Please see admission note from medicine service A further 10 point review of systems was negative except what is listed in the HPI.  Physical Exam: @VITALS2 @ General: No acute distress.  Awake. Head:  Normocephalic.  Atraumatic. ENT:  EOMI.  Mucous membranes moist Neck:  Supple.  No lymphadenopathy. CV:  Regular rate. Pulmonary: Equal effort bilaterally.   Abdomen: Soft.  Non-tender to palpation. Skin:  Normal turgor.  No visible rash. Extremity: No gross deformity of upper extremities.  No gross deformity of lower extremities. Neurologic: Alert. Appropriate mood.  Penis:  Buried.  74 French Foley catheter present.  No lesions. Urethra: Orthotopic meatus. Scrotum: No lesions.  No ecchymosis.  No erythema. Testicles: Descended bilaterally.  No masses bilaterally. Epididymis: Palpable bilaterally.  Non Tender to palpation.  Studies:  Recent Labs    12/25/18 2304 12/26/18 0457  HGB 9.5* 9.3*  WBC 13.9* 13.4*  PLT 443* 416*    Recent Labs    12/25/18 2304 12/26/18 0457  NA 134* 135  K 3.3* 3.9  CL 96* 101  CO2 25 23  BUN 57* 56*  CREATININE 2.23* 2.29*  CALCIUM 8.0* 8.0*  GFRNONAA 28* 27*  GFRAA 32* 31*     No results for input(s): INR, APTT in the last 72 hours.  Invalid input(s): PT   Invalid input(s): ABG  Renal ultrasound is obtained.  I reviewed the patient's images.  There is no hydronephrosis.  Posterior bladder mass noted, more than likely clot.  Assessment: High-grade nonmuscle invasive bladder cancer, status post initial TURBT approximately 2 weeks ago.  The patient is admitted with renal insufficiency, gross hematuria and difficulty voiding.  Foley catheter was placed with relief of retention.  However, this is inadequate for adequate irrigation.  The patient's creatinine has decreased somewhat since his catheter  placement and hospitalization.  Hemoglobin is low, but higher than at the time of discharge from the hospital 2 weeks ago  Plan:  1.  I have recommended that we place a larger catheter, orders have been placed for that as well as for bladder irrigation  2.  No specific urologic recommendations at the present time other than above, as he does not have hydronephrosis.  Provide medical support, we will follow appropriately.    [phone]

## 2018-12-26 NOTE — TOC Progression Note (Signed)
Transition of Care (TOC) - Progression Note    Patient Details  Name: [name]. MRN: [id number] Date of Birth: [birth year]  Transition of Care [facility]) CM/SW Contact  Mounir Skipper, Juliann Pulse, RN Phone Number: 12/26/2018, 3:54 PM  Clinical Narrative:  Alecia Lemming About Glossary [place], [place] [address] [place] TYPE NAME OF FACILITY (optional) Filter by: Distance    Overall rating    Other ratings    # of certified beds    More filters    Clear all filters Showing 1 - 15 of 17 nursing homes Sort by: Closest    1. 1.3 mi [place] [facility] [place], [place] [address] [phone] Overall rating Below average 2. 1.5 mi [facility] [place] [address] [place], [place] [address] [phone] Overall rating Much above average 3. 2.2 mi [facility] [place], [place] [address] [phone] Overall rating Much below average 4. 2.4 mi [facility] [place], [place] [address] [phone] Overall rating Below average 5. 2.7 mi [place], [place] [address] [phone] Overall rating Below average 6. 3.2 mi [facility] [place], [place] [address] [phone] Overall rating Much below average 7. 3.5 [facility] [place], [place] [address] [phone] Overall rating Much above average 8. 3.5 mi [place] [address] [place], [place] [address] [phone] Overall rating Much below average 9. 3.7 [place], [place] [address] [phone] Overall rating Below average 10. 3.9 mi [facility]  [place], [place] [address] [phone] Overall rating Much above average 11. 4.1 [place] [address] [place], [place] [address] [phone] Overall rating Below average 12. 4.4 mi [facility] [address] [place], [place] [address] [phone] Overall rating Much above average 13. 5.8 mi [facility] [address], [place] [address] [phone] Overall rating Average 14. 8.6 [place],  [address] [phone] Overall rating Above average 15. 8.8 mi [facility] and [facility] [employer] [facility] [place],  [address] [phone] Overall rating Much below average <Previous 1 2 Next> Data last updated: December 26, 2018 To explore and download nursing home data,visit the data catalog on CMS.gov    Mapbox  OpenStreetMap Improve this map Consumer alert Nursing homes that have been cited for potential issues related to abuse have the following icon next to their name:   More information about this, and what to do if you suspect abuse has occurred can be found here  About MedicareMedicare Glossary Nondiscrimination/AccessibilityPrivacy Foss this [place] website managed and paid for by the U[place] [employer] for [employer] and [employer]. [place], [place] [address] Medicare.gov Feedback      Expected Discharge Plan: Skilled Nursing Facility Barriers to Discharge: Continued Medical Work up  Expected Discharge Plan and Services Expected Discharge Plan: [place]   Discharge Planning Services: CM Consult   Living arrangements for the past 2 months: [place]  Social Determinants of Health (SDOH)  Interventions    Readmission Risk Interventions No flowsheet data found.

## 2018-12-26 NOTE — Progress Notes (Signed)
Pt with c/o inability to urinate. Pt distended and tender to lower abd area. Bladder scan done with results showing 350. MD paged and new orders received.

## 2018-12-26 NOTE — Progress Notes (Signed)
Foley Catheter inserted per MD orders . Pt tolerated the procedure well and 300 cc dark urine returned after foley insertion. Pt reports less discomfort. Pt with history of recent TURBT November 13. 2020. Maintain current plan of care for Pt

## 2018-12-26 NOTE — Progress Notes (Signed)
PROGRESS NOTE    [name].  [id number] DOB: 02-17-1945 DOA: 12/25/2018 PCP: Katherina Mires, MD   Brief Narrative:  Patient is a 73 year old male with history of hypertension, diabetes, CKD stage III who just underwent transurethral resection of bladder tumor that was found to be malignant on December 07, 2018.  Biopsy showed high-grade papillary urothelial carcinoma.  He was discharged on November 14/2020.  Patient lives alone at home.  Patient reported that he was very weak, unable to get up and was not being.  EMS found him covered in urine and feces.  When he presented he was found to have leukocytosis, hypokalemia, acute kidney injury.  Urinalysis was history of urinary tract infection.  Urine culture, blood culture sent.  Started on IV antibiotics.  Urology also consulted today.  Assessment & Plan:   Active Problems:   FTT (failure to thrive) in adult   Urinary tract infection: Recent history of transurethral resection of bladder tumor.  Urine culture, blood culture sent.  Started on ceftriaxone.  Hemodynamically stable at present.  AKI on CKD stage III: His creatinine was 1.2 on 11/14.  Presented with creatinine of more than 2.  Possible  urinary obstruction.  Foley placed with drainage of bloody urine.  Will check ultrasound of the kidneys.  Bladder cancer: underwent transurethral resection of bladder tumor  on December 07, 2018.  Biopsy showed high-grade papillary urothelial carcinoma.  Oncology will follow.  Failure to thrive/deconditioning/debility: Continue gentle IV fluids.  PT/OT evaluation done.  Recommended skilled facility.  Social worker consulted.  Hypokalemia: Supplemented.  Home hydrochlorothiazide at home.  Prolonged QTc interval: QTC was 530 on presentation.  Will follow up repeat EKG.Hold celexa.         DVT prophylaxis: Heparin Code Status: Full code Family Communication: None present at the bedside Disposition Plan: Skilled nursing facility  after full work-up   Consultants: Urology  Procedures: Foley catheter placement  Antimicrobials:  Anti-infectives (From admission, onward)   Start     Dose/Rate Route Frequency Ordered Stop   12/26/18 2000  cefTRIAXone (ROCEPHIN) 1 g in sodium chloride 0.9 % 100 mL IVPB     1 g 200 mL/hr over 30 Minutes Intravenous Every 24 hours 12/25/18 2248     12/25/18 2000  cefTRIAXone (ROCEPHIN) 1 g in sodium chloride 0.9 % 100 mL IVPB     1 g 200 mL/hr over 30 Minutes Intravenous  Once 12/25/18 1947 12/25/18 2108      Subjective: Patient seen and examined at bedside this morning.  Hemodynamically stable.  Looks deconditioned, debilitated, chronically ill looking.  Feels better after Foley was placed.  Denies any abdomen pain, nausea or vomiting.  Complains of generalized weakness.  Objective: Vitals:   12/25/18 2030 12/25/18 2130 12/25/18 2248 12/26/18 0557  BP: (!) 121/58 119/63 (!) 115/95 134/68  Pulse: 84 85 89 93  Resp: 18 17 20 16   Temp:   97.7 F (36.5 C) (!) 97.3 F (36.3 C)  TempSrc:   Oral Oral  SpO2: 100% 100% 100% 99%  Weight:   94.3 kg   Height:   6\' 1"  (1.854 m)     Intake/Output Summary (Last 24 hours) at 12/26/2018 1254 Last data filed at 12/26/2018 0650 Gross per 24 hour  Intake 387.22 ml  Output 450 ml  Net -62.78 ml   Filed Weights   12/25/18 2248  Weight: 94.3 kg    Examination:  General exam: Generalized weakness, chronically ill looking HEENT:PERRL, Ear/Nose normal  on gross exam Respiratory system: Bilateral equal air entry, normal vesicular breath sounds, no wheezes or crackles  Cardiovascular system: S1 & S2 heard, RRR. No JVD, murmurs, rubs, gallops or clicks. No pedal edema. Gastrointestinal system: Abdomen is nondistended, soft and nontender. No organomegaly or masses felt. Normal bowel sounds heard. Central nervous system: Alert and oriented. No focal neurological deficits. Extremities: No edema, no clubbing ,no cyanosis Skin: no icterus ,no  pallor.  Excoriation/erythematous changes none left groin GU: Foley    Data Reviewed: I have personally reviewed following labs and imaging studies  CBC: Recent Labs  Lab 12/25/18 1513 12/25/18 2304 12/26/18 0457  WBC 15.8* 13.9* 13.4*  NEUTROABS 12.9*  --   --   HGB 10.7* 9.5* 9.3*  HCT 33.3* 29.5* 29.5*  MCV 94.3 95.2 94.9  PLT 492* 443* 123456*   Basic Metabolic Panel: Recent Labs  Lab 12/25/18 1513 12/25/18 2304 12/26/18 0457  NA 134* 134* 135  K 2.7* 3.3* 3.9  CL 93* 96* 101  CO2 25 25 23   GLUCOSE 224* 164* 177*  BUN 62* 57* 56*  CREATININE 2.48* 2.23* 2.29*  CALCIUM 8.8* 8.0* 8.0*  MG  --  2.3 2.3  PHOS  --  2.5  --    GFR: Estimated Creatinine Clearance: 31.5 mL/min (A) (by C-G formula based on SCr of 2.29 mg/dL (H)). Liver Function Tests: Recent Labs  Lab 12/25/18 1513  AST 25  ALT 31  ALKPHOS 123  BILITOT 2.6*  PROT 7.4  ALBUMIN 2.7*   No results for input(s): LIPASE, AMYLASE in the last 168 hours. No results for input(s): AMMONIA in the last 168 hours. Coagulation Profile: No results for input(s): INR, PROTIME in the last 168 hours. Cardiac Enzymes: Recent Labs  Lab 12/25/18 1513  CKTOTAL 33*   BNP (last 3 results) No results for input(s): PROBNP in the last 8760 hours. HbA1C: No results for input(s): HGBA1C in the last 72 hours. CBG: Recent Labs  Lab 12/26/18 0737 12/26/18 1145  GLUCAP 152* 128*   Lipid Profile: No results for input(s): CHOL, HDL, LDLCALC, TRIG, CHOLHDL, LDLDIRECT in the last 72 hours. Thyroid Function Tests: Recent Labs    12/25/18 2304  TSH 2.639   Anemia Panel: No results for input(s): VITAMINB12, FOLATE, FERRITIN, TIBC, IRON, RETICCTPCT in the last 72 hours. Sepsis Labs: No results for input(s): PROCALCITON, LATICACIDVEN in the last 168 hours.  Recent Results (from the past 240 hour(s))  SARS CORONAVIRUS 2 (TAT 6-24 HRS) Nasopharyngeal Nasopharyngeal Swab     Status: None   Collection Time: 12/25/18   7:59 PM   Specimen: Nasopharyngeal Swab  Result Value Ref Range Status   SARS Coronavirus 2 NEGATIVE NEGATIVE Final    Comment: (NOTE) SARS-CoV-2 target nucleic acids are NOT DETECTED. The SARS-CoV-2 RNA is generally detectable in upper and lower respiratory specimens during the acute phase of infection. Negative results do not preclude SARS-CoV-2 infection, do not rule out co-infections with other pathogens, and should not be used as the sole basis for treatment or other patient management decisions. Negative results must be combined with clinical observations, patient history, and epidemiological information. The expected result is Negative. Fact Sheet for Patients: SugarRoll.be Fact Sheet for Healthcare Providers: https://www.woods-mathews.com/ This test is not yet approved or cleared by the [place] FDA and  has been authorized for detection and/or diagnosis of SARS-CoV-2 by FDA under an Emergency Use Authorization (EUA). This EUA will remain  in effect (meaning this test can be used) for the  duration of the COVID-19 declaration under Section 56 4(b)(1) of the Act, 21 U.S.C. section 360bbb-3(b)(1), unless the authorization is terminated or revoked sooner. Performed at [facility], [place] [address], [place], [place] [address]          Radiology Studies: US Renal  Result Date: 12/26/2018 CLINICAL DATA:  Acute kidney injury.  Urinary tract infection. EXAM: RENAL / URINARY TRACT ULTRASOUND COMPLETE COMPARISON:  None. FINDINGS: Right Kidney: Renal measurements: 0.0 x 5.1 x 4.7 cm = volume: 151.3 mL. There is some thinning of the normal renal parenchyma. Renal parenchyma is isoechoic to the index organ, the liver. Liver is mildly hyperechoic. No focal lesions are present in the kidney. There is no stone or mass lesion. No hydronephrosis is present. Left Kidney: Renal measurements: 10.0 x 6.2 x 4.9 cm = volume: 159.3 mL. There is  some thinning of the normal renal parenchyma. Parenchyma is isoechoic to the index organ, the spleen. No focal lesions are present. There is no stone or mass lesion. No hydronephrosis is present. Bladder: Heterogeneous mass lesion is noted in the posterior wall of bladder. This may represent fibrotic clot or calcified mass. This appears separate from the prostate gland. Other: None. IMPRESSION: 1. Acute abnormality.  No acute obstruction. 2. Fall renal parenchyma is hyperechoic bilaterally. This is nonspecific, but can be seen in the setting of medical renal disease. 3. Hyperechoic mass lesion the posterior wall of the urinary bladder. This may represent focal bladder wall mass versus fibrotic clot. Similar finding was noted mid other hemorrhage on the recent CT scan. Follow-up CT of the pelvis with contrast or MRI the pelvis without and with contrast could be useful for further evaluation if clinically indicated. Remaining hemorrhage within the bladder appears to have cleared. Electronically Signed   By: San Morelle M.D.   On: 12/26/2018 07:37   Dg Chest Portable 1 View  Result Date: 12/25/2018 CLINICAL DATA:  73 year old male with weakness. EXAM: PORTABLE CHEST 1 VIEW COMPARISON:  None. FINDINGS: The heart size and mediastinal contours are within normal limits. Both lungs are clear. The visualized skeletal structures are unremarkable. IMPRESSION: No active disease. Electronically Signed   By: Anner Crete M.D.   On: 12/25/2018 16:01        Scheduled Meds: . Chlorhexidine Gluconate Cloth  6 each Topical Daily  . citalopram  20 mg Oral Daily  . heparin  5,000 Units Subcutaneous Q8H  . insulin aspart  0-5 Units Subcutaneous QHS  . insulin aspart  0-9 Units Subcutaneous TID WC  . nystatin cream   Topical BID   Continuous Infusions: . sodium chloride 125 mL/hr at 12/25/18 1835  . sodium chloride 100 mL/hr at 12/26/18 0500  . cefTRIAXone (ROCEPHIN)  IV       LOS: 1 day    Time  spent: 25 mins.More than 50% of that time was spent in counseling and/or coordination of care.      Shelly Coss, MD [facility] Pager [phone]  If 7PM-7AM, please contact night-coverage [email or web address] Password [facility] 12/26/2018, 12:54 PM

## 2018-12-26 NOTE — Evaluation (Signed)
Physical Therapy Evaluation Patient Details Name: [name]. MRN: [id number] DOB: Feb 13, 1945 Today's Date: 12/26/2018   History of Present Illness  73 year old man was admitted to ED on 12/1 for FTT.  He recently had TURP for bladder CA and was unable to get up from his chair for days.  PMH:  CKD and HTN  Clinical Impression   Pt presents with LE weakness, urinary pain, difficulty performing bed mobility and transfer to recliner, inability to ambulate this session, and decreased activity tolerance. Pt to benefit from acute PT to address deficits. Pt required min assist +2 for transfer to recliner at bedside, unable to progress to ambulation today due to pt fatigue and weakness. Pt also with 1 LOB in standing, corrected by PT. PT recommending SNF given lack of social support and mobility deficits. PT to progress mobility as tolerated, and will continue to follow acutely.      Follow Up Recommendations SNF    Equipment Recommendations  Other (comment)(TBD)    Recommendations for Other Services       Precautions / Restrictions Precautions Precautions: Fall Restrictions Weight Bearing Restrictions: No      Mobility  Bed Mobility Overal bed mobility: Needs Assistance Bed Mobility: Supine to Sit     Supine to sit: Min guard     General bed mobility comments: min guard for safety, very increased time and effort with use of bed rails to come to sitting.  Transfers Overall transfer level: Needs assistance Equipment used: Rolling walker (2 wheeled) Transfers: Sit to/from [facility] Sit to Stand: Min assist;+2 safety/equipment Stand pivot transfers: Min assist;+2 safety/equipment       General transfer comment: Min assist to stand and stabilize, followed by short steps to reach recliner. Pt with 1 LOB when backing up to recliner. Pt is not used to RW; cues for safety  Ambulation/Gait Ambulation/Gait assistance: (NT- pt able to take pivotal steps only)               Stairs            Wheelchair Mobility    Modified Rankin (Stroke Patients Only)       Balance Overall balance assessment: Needs assistance Sitting-balance support: No upper extremity supported Sitting balance-Leahy Scale: Good     Standing balance support: Bilateral upper extremity supported Standing balance-Leahy Scale: Fair Standing balance comment: able to stand without UE support, requires support dynamically                             Pertinent Vitals/Pain Pain Assessment: Faces Faces Pain Scale: Hurts a little bit Pain Location: where catheter is Pain Descriptors / Indicators: Discomfort Pain Intervention(s): Limited activity within patient's tolerance;Monitored during session;Repositioned    Home Living Family/patient expects to be discharged to:: Private residence Living Arrangements: Alone Available Help at Discharge: Available PRN/intermittently;Neighbor(friend, who is his next door neighbor) Type of Home: House Home Access: Stairs to enter Entrance Stairs-Rails: Left Entrance Stairs-Number of Steps: 5 Home Layout: One level Home Equipment: None      Prior Function Level of Independence: Independent         Comments: per report.  Was unable to get up for days     Hand Dominance   Dominant Hand: Right    Extremity/Trunk Assessment   Upper Extremity Assessment Upper Extremity Assessment: Defer to OT evaluation    Lower Extremity Assessment Lower Extremity Assessment: Generalized weakness  Cervical / Trunk Assessment Cervical / Trunk Assessment: Normal  Communication   Communication: No difficulties  Cognition Arousal/Alertness: Awake/alert Behavior During Therapy: WFL for tasks assessed/performed Overall Cognitive Status: No family/caregiver present to determine baseline cognitive functioning                                 General Comments: decreased initiation with meal set up after  saying he wanted to eat; cues for safety with SPT.        General Comments      Exercises     Assessment/Plan    PT Assessment Patient needs continued PT services  PT Problem List Decreased strength;Decreased mobility;Decreased safety awareness;Decreased activity tolerance;Decreased balance;Decreased knowledge of use of DME;Pain       PT Treatment Interventions DME instruction;Therapeutic activities;Gait training;Therapeutic exercise;Patient/family education;Balance training;Stair training;Functional mobility training    PT Goals (Current goals can be found in the Care Plan section)  Acute Rehab PT Goals Patient Stated Goal: none stated PT Goal Formulation: With patient Time For Goal Achievement: 01/09/19 Potential to Achieve Goals: Good    Frequency Min 2X/week   Barriers to discharge        Co-evaluation PT/OT/SLP Co-Evaluation/Treatment: Yes Reason for Co-Treatment: For patient/therapist safety PT goals addressed during session: Mobility/safety with mobility OT goals addressed during session: ADL's and self-care       AM-PAC PT "6 Clicks" Mobility  Outcome Measure Help needed turning from your back to your side while in a flat bed without using bedrails?: A Little Help needed moving from lying on your back to sitting on the side of a flat bed without using bedrails?: A Little Help needed moving to and from a bed to a chair (including a wheelchair)?: A Little Help needed standing up from a chair using your arms (e.g., wheelchair or bedside chair)?: A Little Help needed to walk in hospital room?: A Lot Help needed climbing 3-5 steps with a railing? : A Lot 6 Click Score: 16    End of Session Equipment Utilized During Treatment: Gait belt Activity Tolerance: Patient tolerated treatment well;Patient limited by fatigue Patient left: in chair;with chair alarm set;with call bell/phone within reach Nurse Communication: Mobility status PT Visit Diagnosis: Difficulty in  walking, not elsewhere classified (R26.2);Muscle weakness (generalized) (M62.81)    Time: 0950-1009 PT Time Calculation (min) (ACUTE ONLY): 19 min   Charges:   PT Evaluation $PT Eval Low Complexity: 1 Low         Markey Deady E, PT Acute Rehabilitation Services Pager [phone]  Office [phone]  [place] D Estera Ozier 12/26/2018, 11:59 AM

## 2018-12-26 NOTE — Progress Notes (Signed)
Pt admitted to room 1418 12/25/2018 with FTT. Pt is a/o x 4, abrasions and bruises on legs, peri area and upper inner thighs excoriated, left greater than right, on inner L thigh, a large red pimple is noted.  Cream applied to all areas. sacrum red yet blanchable, foam dressing applied to area.  Pt denies pain, IV fluids infusing.

## 2018-12-26 NOTE — Progress Notes (Signed)
Pt refused to have new catheter placed.

## 2018-12-26 NOTE — Progress Notes (Signed)
Initial Nutrition Assessment  RD working remotely.   DOCUMENTATION CODES:   Not applicable  INTERVENTION:  - will order Ensure Enlive BID, each supplement provides 350 kcal and 20 grams of protein. - will order Boost Breeze once/day, each supplement provides 250 kcal and 9 grams of protein. - will order 30 mL Prostat once/day, each supplement provides 100 kcal and 15 grams of protein. - will order daily multivitamin with minerals. - continue to encourage PO intakes.  * will follow-up 12/3 and 12/4 regarding Calorie Count   NUTRITION DIAGNOSIS:   Increased nutrient needs related to catabolic illness, cancer and cancer related treatments, acute illness as evidenced by estimated needs.  GOAL:   Patient will meet greater than or equal to 90% of their needs  MONITOR:   PO intake, Supplement acceptance, Labs, Weight trends, I & O's  REASON FOR ASSESSMENT:   Consult Calorie Count, Assessment of nutrition requirement/status  ASSESSMENT:   73 year old male with history of HTN, DM, stage 3 CKD. He recently (12/07/18) underwent transurethral resection of bladder tumor that was found to be malignant.  Biopsy showed high-grade papillary urothelial carcinoma. Patient lives alone at home.  Patient reported that he was very weak, unable to get up.  EMS found him covered in urine and feces. In the ED he was diagnosed with leukocytosis, hypokalemia, AKI. Urology has been consulted.  Patient has been eating 75-100% of meals since admission. He denies any recent changes in appetite or difficulties with chewing or swallowing, but states that he has been unintentionally losing weight and that he was having difficulty accessing food at home after recent discharge d/t difficulty and eventual inability to ambulate.   Per chart review, current weight is 208 lb and weight on 12/06/18 (date prior to surgery) was 238 lb. This indicates 30 lb weight loss (12.6% body weight) in 3 weeks. Will need to  continue to monitor weight trends closely. Patient is at very high risk for malnutrition, but unable to identify malnutrition today d/t inability to complete NFPE.   Per notes: - UTI - AKI with hx of stage 3 CKD - bladder cancer s/p recent transurethral resection of bladder tumor - FTT, deconditioning/debility-- PT and OT recommending SNF; Social Work consulted - hypokalemia--repletion ordered, now [employer] reviewed; CBGs: 152 and 128 mg/dl, BUN: 56 mg/dl, creatinine: 2.29 mg/dl, Ca: 8 mg/dl, GFR: 27 ml/min.  Medications reviewed; sliding scale novolog, 10 mEq IV KCl x2 runs 12/1, 40 mEq Klor-Con x1 dose 12/1 and x2 doses 12/2.  IVF; NS @ 100 ml/hr.      NUTRITION - FOCUSED PHYSICAL EXAM:  unable to complete at this time.   Diet Order:   Diet Order            Diet heart healthy/carb modified Room service appropriate? Yes; Fluid consistency: Thin  Diet effective now              EDUCATION NEEDS:   No education needs have been identified at this time  Skin:  Skin Assessment: Reviewed RN Assessment  Last BM:  12/1  Height:   Ht Readings from Last 1 Encounters:  12/25/18 6\' 1"  (1.854 m)    Weight:   Wt Readings from Last 1 Encounters:  12/25/18 94.3 kg    Ideal Body Weight:  83.6 kg  BMI:  Body mass index is 27.43 kg/m.  Estimated Nutritional Needs:   Kcal:  2200-2400 kcal  Protein:  115-125 grams  Fluid:  >/= 2.2 L/day  Jarome Matin, MS, RD, LDN, [facility] Inpatient Clinical Dietitian Pager # [phone] After hours/weekend pager # [phone]

## 2018-12-26 NOTE — TOC Progression Note (Signed)
Transition of Care (TOC) - Progression Note    Patient Details  Name: Brayten Langa. MRN: [id number] Date of Birth: 08-Feb-1945  Transition of Care [facility]) CM/SW Contact  Hadlie Gipson, Juliann Pulse, RN Phone Number: 12/26/2018, 3:54 PM  Clinical Narrative:  Provided patient w/bed offers await choice-he will discuss w/his neighbor Collie Siad.     Expected Discharge Plan: [place] Barriers to Discharge: Continued Medical Work up  Expected Discharge Plan and Services Expected Discharge Plan: [place]   Discharge Planning Services: CM Consult   Living arrangements for the past 2 months: Single Family Home                                       Social Determinants of Health (SDOH) Interventions    Readmission Risk Interventions No flowsheet data found.

## 2018-12-26 NOTE — NC FL2 (Signed)
[place] LEVEL OF CARE SCREENING TOOL     IDENTIFICATION  Patient Name: [name]. Birthdate: [birth year] Sex: male Admission Date (Current Location): 12/25/2018  [facility] and [place] Number:  Herbalist and Address:  [facility],  [place] [address] [place]      Provider Number: [id number]  Attending Physician Name and Address:  Shelly Coss, MD  Relative Name and Phone Number:  Marcelino Duster [phone]    Current Level of Care: Hospital Recommended Level of Care: [place] Prior Approval Number:    Date Approved/Denied:   PASRR Number: [id number]  Discharge Plan: SNF    Current Diagnoses: Patient Active Problem List   Diagnosis Date Noted  . FTT (failure to thrive) in adult 12/25/2018  . Bladder tumor 12/07/2018    Orientation RESPIRATION BLADDER Height & Weight     Self, Time, Situation, Place  Normal Indwelling catheter(urine retention) Weight: 94.3 kg Height:  6\' 1"  (185.4 cm)  BEHAVIORAL SYMPTOMS/MOOD NEUROLOGICAL BOWEL NUTRITION STATUS      Continent Diet(CHO mod)  AMBULATORY STATUS COMMUNICATION OF NEEDS Skin   Limited Assist   Skin abrasions(excoriated peri area-nystatin cream qd)                       Personal Care Assistance Level of Assistance  Bathing, Feeding, Dressing Bathing Assistance: Limited assistance Feeding assistance: Limited assistance Dressing Assistance: Limited assistance     Functional Limitations Info  Sight, Hearing, Speech Sight Info: Adequate Hearing Info: Adequate Speech Info: Adequate    SPECIAL CARE FACTORS FREQUENCY  PT (By licensed PT), OT (By licensed OT)     PT Frequency: 5x week OT Frequency: 5x week            Contractures Contractures Info: Not present    Additional Factors Info  Code Status Code Status Info: Full code             Current Medications (12/26/2018):  This is the current hospital active medication  list Current Facility-Administered Medications  Medication Dose Route Frequency Provider Last Rate Last Dose  . 0.9 %  sodium chloride infusion   Intravenous Continuous Dana Allan I, MD 100 mL/hr at 12/26/18 0500    . cefTRIAXone (ROCEPHIN) 1 g in sodium chloride 0.9 % 100 mL IVPB  1 g Intravenous Q24H Dana Allan I, MD      . Chlorhexidine Gluconate Cloth 2 % PADS 6 each  6 each Topical Daily Shelly Coss, MD   6 each at 12/26/18 1024  . fluticasone (FLONASE) 50 MCG/ACT nasal spray 2 spray  2 spray Each Nare Daily PRN Dana Allan I, MD      . heparin injection 5,000 Units  5,000 Units Subcutaneous Q8H Dana Allan I, MD   5,000 Units at 12/26/18 0555  . insulin aspart (novoLOG) injection 0-5 Units  0-5 Units Subcutaneous QHS Dana Allan I, MD      . insulin aspart (novoLOG) injection 0-9 Units  0-9 Units Subcutaneous TID WC Dana Allan I, MD   1 Units at 12/26/18 1250  . nystatin cream (MYCOSTATIN)   Topical BID Shelly Coss, MD      . oxybutynin (DITROPAN) tablet 5 mg  5 mg Oral Q8H PRN Dana Allan I, MD      . polyvinyl alcohol (LIQUIFILM TEARS) 1.4 % ophthalmic solution 1 drop  1 drop Both Eyes TID PRN Bonnell Public, MD  Discharge Medications: Please see discharge summary for a list of discharge medications.  Relevant Imaging Results:  Relevant Lab Results:   Additional Information ss#[id number], [place], [place]

## 2018-12-26 NOTE — TOC Initial Note (Signed)
Transition of Care (TOC) - Initial/Assessment Note    Patient Details  Name: [name]. MRN: [id number] Date of Birth: 05/25/1945  Transition of Care [facility]) CM/SW Contact:    Dessa Phi, RN Phone Number: 12/26/2018, 1:10 PM  Clinical Narrative:Patient agreed to SNF-Faxed out await bed offers.                   Expected Discharge Plan: Skilled Nursing Facility Barriers to Discharge: Continued Medical Work up   Patient Goals and CMS Choice        Expected Discharge Plan and Services Expected Discharge Plan: [place]   Discharge Planning Services: CM Consult   Living arrangements for the past 2 months: Single Family Home                                      Prior Living Arrangements/Services Living arrangements for the past 2 months: Single Family Home Lives with:: Self Patient language and need for interpreter reviewed:: Yes Do you feel safe going back to the place where you live?: Yes      Need for Family Participation in Patient Care: No (Comment) Care giver support system in place?: Yes (comment)   Criminal Activity/Legal Involvement Pertinent to Current Situation/Hospitalization: No - Comment as needed  Activities of Daily Living Home Assistive Devices/Equipment: None ADL Screening (condition at time of admission) Patient's cognitive ability adequate to safely complete daily activities?: Yes Is the patient deaf or have difficulty hearing?: No Does the patient have difficulty seeing, even when wearing glasses/contacts?: No Does the patient have difficulty concentrating, remembering, or making decisions?: No Patient able to express need for assistance with ADLs?: No Does the patient have difficulty dressing or bathing?: Yes Independently performs ADLs?: Yes (appropriate for developmental age) Does the patient have difficulty walking or climbing stairs?: Yes Weakness of Legs: Both Weakness of Arms/Hands: None  Permission  Sought/Granted Permission sought to share information with : Case Manager Permission granted to share information with : Yes, Verbal Permission Granted  Share Information with NAME: Marcelino Duster neighbor [phone]  Permission granted to share info w AGENCY: SNF        Emotional Assessment Appearance:: Appears stated age Attitude/Demeanor/Rapport: Gracious Affect (typically observed): Accepting Orientation: : Oriented to Self, Oriented to Place, Oriented to  Time, Oriented to Situation Alcohol / Substance Use: Alcohol Use, Tobacco Use Psych Involvement: No (comment)  Admission diagnosis:  Dehydration [E86.0] Hypokalemia [E87.6] Acute cystitis without hematuria [N30.00] AKI (acute kidney injury) ([place]) [N17.9] Patient Active Problem List   Diagnosis Date Noted  . FTT (failure to thrive) in adult 12/25/2018  . Bladder tumor 12/07/2018   PCP:  Katherina Mires, MD Pharmacy:   [facility] [place] [place] - [id number] [place] [facility] [place] [address] Phone: [phone] Fax: [phone]     Social Determinants of Health (SDOH) Interventions    Readmission Risk Interventions No flowsheet data found.

## 2018-12-26 NOTE — Consult Note (Signed)
Urology Consult  Consulting MD: Adhikari,Amrit MD  CC: Blood in urine, renal insufficiency, history of bladder cancer  HPI: This is a 73year old male 2 weeks out from transurethral resection of bladder tumor, very large volume.  Final pathology stage T1.  He is readmitted with weakness, poor mobility, poor oral intake, dizziness and incontinence of stool and urine.  In the emergency room serum creatinine was 2.48.  Hemoglobin approximately 10.  He is admitted for medical management, urologic consultation is requested for the patient's gross hematuria and current condition.  He is unaware of any recent fever.  He is not having any specific pain, either in his abdomen or flank/back.  PMH: Past Medical History:  Diagnosis Date  . Hypertension   . Pneumonia    as a child  . Pre-diabetes     PSH: Past Surgical History:  Procedure Laterality Date  . CHOLECYSTECTOMY  2003  . FRACTURE SURGERY  1996   skull fx  . TRANSURETHRAL RESECTION OF BLADDER TUMOR N/A 12/07/2018   Procedure: TRANSURETHRAL RESECTION OF BLADDER TUMOR (TURBT);  Surgeon: Lucas Mallow, MD;  Location: [facility] ORS;  Service: Urology;  Laterality: N/A;    Allergies: No Known Allergies  Medications: Medications Prior to Admission  Medication Sig Dispense Refill Last Dose  . citalopram (CELEXA) 20 MG tablet Take 20 mg by mouth daily.    Past Week at Unknown time  . fluticasone (FLONASE) 50 MCG/ACT nasal spray Place 2 sprays into both nostrils daily as needed for allergies.   Past Month at Unknown time  . hydroxypropyl methylcellulose / hypromellose (ISOPTO TEARS / GONIOVISC) 2.5 % ophthalmic solution Place 1 drop into both eyes 3 (three) times daily as needed for dry eyes.   Past Week at Unknown time  . lisinopril-hydrochlorothiazide (PRINZIDE,ZESTORETIC) 10-12.5 MG tablet Take 1 tablet by mouth daily.    Past Week at Unknown time  . metFORMIN (GLUCOPHAGE-XR) 500 MG 24 hr tablet Take 500 mg by mouth daily with breakfast.    Past Week at Unknown time  . oxybutynin (DITROPAN) 5 MG tablet Take 1 tablet (5 mg total) by mouth every 8 (eight) hours as needed for bladder spasms. 15 tablet 0 Past Week at Unknown time  . HYDROcodone-acetaminophen (NORCO) 5-325 MG tablet Take 1 tablet by mouth every 4 (four) hours as needed for moderate pain. (Patient not taking: Reported on 12/19/2018) 10 tablet 0 Not Taking at Unknown time     Social History: Social History   Socioeconomic History  . Marital status: Single    Spouse name: Not on file  . Number of children: Not on file  . Years of education: Not on file  . Highest education level: Not on file  Occupational History  . Not on file  Social Needs  . Financial resource strain: Not on file  . Food insecurity    Worry: Not on file    Inability: Not on file  . Transportation needs    Medical: Not on file    Non-medical: Not on file  Tobacco Use  . Smoking status: Former Smoker    Packs/day: 1.00    Years: 15.00    Pack years: 15.00    Types: Cigarettes    Quit date: 12/05/1977    Years since quitting: 41.0  . Smokeless tobacco: Never Used  Substance and Sexual Activity  . Alcohol use: Yes  . Drug use: Not Currently  . Sexual activity: Not on file  Lifestyle  . Physical activity  Days per week: Not on file    Minutes per session: Not on file  . Stress: Not on file  Relationships  . Social Herbalist on phone: Not on file    Gets together: Not on file    Attends religious service: Not on file    Active member of club or organization: Not on file    Attends meetings of clubs or organizations: Not on file    Relationship status: Not on file  . Intimate partner violence    Fear of current or ex partner: Not on file    Emotionally abused: Not on file    Physically abused: Not on file    Forced sexual activity: Not on file  Other Topics Concern  . Not on file  Social History Narrative  . Not on file    Family History: History  reviewed. No pertinent family history.  Review of Systems: Positive: Urinary incontinence, gross hematuria, mild dysuria. Negative: Please see admission note from medicine service A further 10 point review of systems was negative except what is listed in the HPI.  Physical Exam: @VITALS2 @ General: No acute distress.  Awake. Head:  Normocephalic.  Atraumatic. ENT:  EOMI.  Mucous membranes moist Neck:  Supple.  No lymphadenopathy. CV:  Regular rate. Pulmonary: Equal effort bilaterally.   Abdomen: Soft.  Non-tender to palpation. Skin:  Normal turgor.  No visible rash. Extremity: No gross deformity of upper extremities.  No gross deformity of lower extremities. Neurologic: Alert. Appropriate mood.  Penis:  Buried.  18 French Foley catheter present.  No lesions. Urethra: Orthotopic meatus. Scrotum: No lesions.  No ecchymosis.  No erythema. Testicles: Descended bilaterally.  No masses bilaterally. Epididymis: Palpable bilaterally.  Non Tender to palpation.  Studies:  Recent Labs    12/25/18 2304 12/26/18 0457  HGB 9.5* 9.3*  WBC 13.9* 13.4*  PLT 443* 416*    Recent Labs    12/25/18 2304 12/26/18 0457  NA 134* 135  K 3.3* 3.9  CL 96* 101  CO2 25 23  BUN 57* 56*  CREATININE 2.23* 2.29*  CALCIUM 8.0* 8.0*  GFRNONAA 28* 27*  GFRAA 32* 31*     No results for input(s): INR, APTT in the last 72 hours.  Invalid input(s): PT   Invalid input(s): ABG  Renal ultrasound is obtained.  I reviewed the patient's images.  There is no hydronephrosis.  Posterior bladder mass noted, more than likely clot.  Assessment: High-grade nonmuscle invasive bladder cancer, status post initial TURBT approximately 2 weeks ago.  The patient is admitted with renal insufficiency, gross hematuria and difficulty voiding.  Foley catheter was placed with relief of retention.  However, this is inadequate for adequate irrigation.  The patient's creatinine has decreased somewhat since his catheter  placement and hospitalization.  Hemoglobin is low, but higher than at the time of discharge from the hospital 2 weeks ago  Plan:  1.  I have recommended that we place a larger catheter, orders have been placed for that as well as for bladder irrigation  2.  No specific urologic recommendations at the present time other than above, as he does not have hydronephrosis.  Provide medical support, we will follow appropriately.    [phone]

## 2018-12-27 DIAGNOSIS — R627 Adult failure to thrive: Secondary | ICD-10-CM | POA: Diagnosis not present

## 2018-12-27 LAB — BASIC METABOLIC PANEL
Anion gap: 11 (ref 5–15)
BUN: 38 mg/dL — ABNORMAL HIGH (ref 8–23)
CO2: 21 mmol/L — ABNORMAL LOW (ref 22–32)
Calcium: 8 mg/dL — ABNORMAL LOW (ref 8.9–10.3)
Chloride: 104 mmol/L (ref 98–111)
Creatinine, Ser: 1.78 mg/dL — ABNORMAL HIGH (ref 0.61–1.24)
GFR calc Af Amer: 42 mL/min — ABNORMAL LOW (ref >60)
GFR calc non Af Amer: 37 mL/min — ABNORMAL LOW (ref >60)
Glucose, Bld: 123 mg/dL — ABNORMAL HIGH (ref 70–99)
Potassium: 3.9 mmol/L (ref 3.5–5.1)
Sodium: 136 mmol/L (ref 135–145)

## 2018-12-27 LAB — CBC WITH DIFFERENTIAL/PLATELET
Abs Immature Granulocytes: 0.23 10*3/uL — ABNORMAL HIGH (ref 0.00–0.07)
Basophils Absolute: 0 10*3/uL (ref 0.0–0.1)
Basophils Relative: 0 %
Eosinophils Absolute: 0.1 10*3/uL (ref 0.0–0.5)
Eosinophils Relative: 1 %
HCT: 27.9 % — ABNORMAL LOW (ref 39.0–52.0)
Hemoglobin: 8.6 g/dL — ABNORMAL LOW (ref 13.0–17.0)
Immature Granulocytes: 3 %
Lymphocytes Relative: 16 %
Lymphs Abs: 1.5 10*3/uL (ref 0.7–4.0)
MCH: 30 pg (ref 26.0–34.0)
MCHC: 30.8 g/dL (ref 30.0–36.0)
MCV: 97.2 fL (ref 80.0–100.0)
Monocytes Absolute: 0.6 10*3/uL (ref 0.1–1.0)
Monocytes Relative: 7 %
Neutro Abs: 6.9 10*3/uL (ref 1.7–7.7)
Neutrophils Relative %: 73 %
Platelets: 310 10*3/uL (ref 150–400)
RBC: 2.87 MIL/uL — ABNORMAL LOW (ref 4.22–5.81)
RDW: 17.3 % — ABNORMAL HIGH (ref 11.5–15.5)
WBC: 9.3 10*3/uL (ref 4.0–10.5)
nRBC: 0 % (ref 0.0–0.2)

## 2018-12-27 LAB — GLUCOSE, CAPILLARY
Glucose-Capillary: 118 mg/dL — ABNORMAL HIGH (ref 70–99)
Glucose-Capillary: 168 mg/dL — ABNORMAL HIGH (ref 70–99)
Glucose-Capillary: 138 mg/dL — ABNORMAL HIGH (ref 70–99)
Glucose-Capillary: 171 mg/dL — ABNORMAL HIGH (ref 70–99)

## 2018-12-27 NOTE — TOC Progression Note (Signed)
Transition of Care (TOC) - Progression Note    Patient Details  Name: [name]. MRN: [id number] Date of Birth: May 10, 1945  Transition of Care [facility]) CM/SW Contact  Shonn Farruggia, Juliann Pulse, RN Phone Number: 12/27/2018, 2:03 PM  Clinical Narrative: Josem Kaufmann received [id number] from 12/4-12/8-NRD 12/8 fax clinicals for continued stay [phone], tel#[phone] rep Shelba Flake. [employer] rep [place] notified. D/c plan in am. MD updated. covid neg 12/1.      Expected Discharge Plan: [place] Barriers to Discharge: Continued Medical Work up  Expected Discharge Plan and Services Expected Discharge Plan: [place]   Discharge Planning Services: CM Consult   Living arrangements for the past 2 months: Single Family Home                                       Social Determinants of Health (SDOH) Interventions    Readmission Risk Interventions No flowsheet data found.

## 2018-12-27 NOTE — TOC Progression Note (Signed)
Transition of Care (TOC) - Progression Note    Patient Details  Name: [name]. MRN: [id number] Date of Birth: September 01, 1945  Transition of Care [facility]) CM/SW Contact  Arn Mcomber, Juliann Pulse, RN Phone Number: 12/27/2018, 11:18 AM  Clinical Narrative:  TC [place] medicare to initiate auth-await auth.      Expected Discharge Plan: Skilled Nursing Facility Barriers to Discharge: Insurance Authorization  Expected Discharge Plan and Services Expected Discharge Plan: [place]   Discharge Planning Services: CM Consult   Living arrangements for the past 2 months: Single Family Home                                       Social Determinants of Health (SDOH) Interventions    Readmission Risk Interventions No flowsheet data found.

## 2018-12-27 NOTE — Progress Notes (Signed)
NUTRITION NOTE  Calorie Count in place with scheduled follow-up today and tomorrow. RN at bedside and reports that Calorie Count envelope was not hung until late morning. Breakfast tray still in the room and patient had not yet eaten lunch. The only item consumed from breakfast tray was 100% of blueberry muffin (136 kcal, 1 gram protein).   RD will follow-up again tomorrow (12/4).   Jarome Matin, MS, RD, LDN, [facility] Inpatient Clinical Dietitian Pager # [phone] After hours/weekend pager # [phone]

## 2018-12-27 NOTE — Progress Notes (Signed)
Pt alert, eating dinner, denies pain at this time, foley patent draining light red urine, skin to thighs and peri area less red than yesterday, foam dressing to coccyx.  Pt area of surgery in morning, no orders thus far.

## 2018-12-27 NOTE — Progress Notes (Signed)
PROGRESS NOTE    Adline Peals.  RP:[id number] DOB: 17-Jan-1946 DOA: 12/25/2018 PCP: Katherina Mires, MD   Brief Narrative:  Patient is a 73 year old male with history of hypertension, diabetes, CKD stage III who just underwent transurethral resection of bladder tumor that was found to be malignant on December 07, 2018.  Biopsy showed high-grade papillary urothelial carcinoma.  He was discharged on November 14/2020.  Patient lives alone at home.  Patient reported that he was very weak, unable to get up and was not being.  EMS found him covered in urine and feces.  When he presented he was found to have leukocytosis, hypokalemia, acute kidney injury.  Urinalysis was suggestive  of urinary tract infection.  Urine culture, blood culture sent.  Started on IV antibiotics.  Urology also consulted , plan for further intervention.  His kidney function improved after Foley catheter placement.  PT evaluated him and recommended skilled facility on discharge. He will be ready for discharge to skilled nursing facility tomorrow.  Assessment & Plan:   Active Problems:   FTT (failure to thrive) in adult   Urinary tract infection: Recent history of transurethral resection of bladder tumor.  Urine culture showing significant gram-negative rods.  Blood culture has not shown any growth till date.  Started on ceftriaxone.  Hemodynamically stable at present.  AKI on CKD stage III: His creatinine was 1.2 on 11/14.  Presented with creatinine of more than 2.  Possible  urinary obstruction.  Foley placed with drainage of bloody urine.  Ultrasound of the kidneys did not show any obvious blockage.  Kidney function improving  Bladder cancer: underwent transurethral resection of bladder tumor  on December 07, 2018.  Biopsy showed high-grade papillary urothelial carcinoma.  Urology was following.  Recommend to follow-up as an outpatient.  Failure to thrive/deconditioning/debility:.  PT/OT evaluation done.  Recommended  skilled nursing facility.  Case manager following.  Hypokalemia: Supplemented.  Home hydrochlorothiazide on hold.  Prolonged QTc interval: QTC was 530 on presentation.Improved to 469.  Nutrition Problem: Increased nutrient needs Etiology: catabolic illness, cancer and cancer related treatments, acute illness      DVT prophylaxis: Heparin Code Status: Full code Family Communication: None present at the bedside Disposition Plan: Skilled nursing facility    Consultants: Urology  Procedures: Foley catheter placement  Antimicrobials:  Anti-infectives (From admission, onward)   Start     Dose/Rate Route Frequency Ordered Stop   12/26/18 2000  cefTRIAXone (ROCEPHIN) 1 g in sodium chloride 0.9 % 100 mL IVPB     1 g 200 mL/hr over 30 Minutes Intravenous Every 24 hours 12/25/18 2248     12/25/18 2000  cefTRIAXone (ROCEPHIN) 1 g in sodium chloride 0.9 % 100 mL IVPB     1 g 200 mL/hr over 30 Minutes Intravenous  Once 12/25/18 1947 12/25/18 2108      Subjective:  Patient seen and examined the bedside this morning.  Hemodynamically stable.  Foley catheter changed to large bore and having good urine output after that.  He feels much better today.  Denies any new complaints.  Objective: Vitals:   12/26/18 0557 12/26/18 1326 12/26/18 2207 12/27/18 0539  BP: 134/68 131/61 (!) 127/59 137/87  Pulse: 93 79 79 (!) 106  Resp: 16 18 20 20   Temp: (!) 97.3 F (36.3 C) (!) 97.5 F (36.4 C) 97.8 F (36.6 C) (!) 97.5 F (36.4 C)  TempSrc: Oral Oral Oral Oral  SpO2: 99% 98% 100% 100%  Weight:  Height:        Intake/Output Summary (Last 24 hours) at 12/27/2018 1311 Last data filed at 12/27/2018 0600 Gross per 24 hour  Intake 2553.09 ml  Output 1430 ml  Net 1123.09 ml   Filed Weights   12/25/18 2248  Weight: 94.3 kg    Examination:  General exam: Comfortable this morning, eating his breakfast  HEENT:PERRL, Ear/Nose normal on gross exam Respiratory system: Bilateral equal air  entry, normal vesicular breath sounds, no wheezes or crackles  Cardiovascular system: S1 & S2 heard, RRR. No JVD, murmurs, rubs, gallops or clicks. No pedal edema. Gastrointestinal system: Abdomen is nondistended, soft and nontender. No organomegaly or masses felt. Normal bowel sounds heard. Central nervous system: Alert and oriented. No focal neurological deficits. Extremities: No edema, no clubbing ,no cyanosis Skin: no icterus ,no pallor.  Excoriation/erythematous changes on left groin GU: Foley    Data Reviewed: I have personally reviewed following labs and imaging studies  CBC: Recent Labs  Lab 12/25/18 1513 12/25/18 2304 12/26/18 0457 12/27/18 0452  WBC 15.8* 13.9* 13.4* 9.3  NEUTROABS 12.9*  --   --  6.9  HGB 10.7* 9.5* 9.3* 8.6*  HCT 33.3* 29.5* 29.5* 27.9*  MCV 94.3 95.2 94.9 97.2  PLT 492* 443* 416* [id number]   Basic Metabolic Panel: Recent Labs  Lab 12/25/18 1513 12/25/18 2304 12/26/18 0457 12/27/18 0452  NA 134* 134* 135 136  K 2.7* 3.3* 3.9 3.9  CL 93* 96* 101 104  CO2 25 25 23  21*  GLUCOSE 224* 164* 177* 123*  BUN 62* 57* 56* 38*  CREATININE 2.48* 2.23* 2.29* 1.78*  CALCIUM 8.8* 8.0* 8.0* 8.0*  MG  --  2.3 2.3  --   PHOS  --  2.5  --   --    GFR: Estimated Creatinine Clearance: 40.5 mL/min (A) (by C-G formula based on SCr of 1.78 mg/dL (H)). Liver Function Tests: Recent Labs  Lab 12/25/18 1513  AST 25  ALT 31  ALKPHOS 123  BILITOT 2.6*  PROT 7.4  ALBUMIN 2.7*   No results for input(s): LIPASE, AMYLASE in the last 168 hours. No results for input(s): AMMONIA in the last 168 hours. Coagulation Profile: No results for input(s): INR, PROTIME in the last 168 hours. Cardiac Enzymes: Recent Labs  Lab 12/25/18 1513  CKTOTAL 33*   BNP (last 3 results) No results for input(s): PROBNP in the last 8760 hours. HbA1C: No results for input(s): HGBA1C in the last 72 hours. CBG: Recent Labs  Lab 12/26/18 1145 12/26/18 1644 12/26/18 2208 12/27/18 0742  12/27/18 1224  GLUCAP 128* 109* 118* 118* 168*   Lipid Profile: No results for input(s): CHOL, HDL, LDLCALC, TRIG, CHOLHDL, LDLDIRECT in the last 72 hours. Thyroid Function Tests: Recent Labs    12/25/18 2304  TSH 2.639   Anemia Panel: No results for input(s): VITAMINB12, FOLATE, FERRITIN, TIBC, IRON, RETICCTPCT in the last 72 hours. Sepsis Labs: No results for input(s): PROCALCITON, LATICACIDVEN in the last 168 hours.  Recent Results (from the past 240 hour(s))  Urine culture     Status: Abnormal (Preliminary result)   Collection Time: 12/25/18  6:43 PM   Specimen: Urine, Clean Catch  Result Value Ref Range Status   Specimen Description   Final    URINE, CLEAN CATCH Performed at [facility], [place] [address] [place], [place] [address]    Special Requests   Final    NONE Performed at [facility], [place] [address] [place],  [place] [address]    Culture (A)  Final    >=100,000 COLONIES/mL GRAM NEGATIVE RODS IDENTIFICATION AND SUSCEPTIBILITIES TO FOLLOW Performed at [facility], [place] [address] [place], [place] [address]    Report Status PENDING  Incomplete  SARS CORONAVIRUS 2 (TAT 6-24 HRS) Nasopharyngeal Nasopharyngeal Swab     Status: None   Collection Time: 12/25/18  7:59 PM   Specimen: Nasopharyngeal Swab  Result Value Ref Range Status   SARS Coronavirus 2 NEGATIVE NEGATIVE Final    Comment: (NOTE) SARS-CoV-2 target nucleic acids are NOT DETECTED. The SARS-CoV-2 RNA is generally detectable in upper and lower respiratory specimens during the acute phase of infection. Negative results do not preclude SARS-CoV-2 infection, do not rule out co-infections with other pathogens, and should not be used as the sole basis for treatment or other patient management decisions. Negative results must be combined with clinical observations, patient history, and epidemiological information. The expected result is Negative. Fact Sheet for  Patients: SugarRoll.be Fact Sheet for Healthcare Providers: https://www.woods-mathews.com/ This test is not yet approved or cleared by the [place] FDA and  has been authorized for detection and/or diagnosis of SARS-CoV-2 by FDA under an Emergency Use Authorization (EUA). This EUA will remain  in effect (meaning this test can be used) for the duration of the COVID-19 declaration under Section 56 4(b)(1) of the Act, 21 U.S.C. section 360bbb-3(b)(1), unless the authorization is terminated or revoked sooner. Performed at [facility], [place] [address], [place], [place] [address]   Culture, blood (routine x 2)     Status: None (Preliminary result)   Collection Time: 12/26/18  8:37 AM   Specimen: BLOOD  Result Value Ref Range Status   Specimen Description   Final    BLOOD LEFT ANTECUBITAL Performed at [facility] [address] [place], [place] [address]    Special Requests   Final    BOTTLES DRAWN AEROBIC AND ANAEROBIC Blood Culture adequate volume Performed at [facility] [address] [place], [place] [address]    Culture   Final    NO GROWTH < 24 HOURS Performed at [facility] [address], [place], [place] [address]    Report Status PENDING  Incomplete  Culture, blood (routine x 2)     Status: None (Preliminary result)   Collection Time: 12/26/18  8:44 AM   Specimen: BLOOD  Result Value Ref Range Status   Specimen Description   Final    BLOOD RIGHT ARM Performed at [facility] [address], [place], [place] [address]    Special Requests   Final    BOTTLES DRAWN AEROBIC AND ANAEROBIC Blood Culture results may not be optimal due to an inadequate volume of blood received in culture bottles Performed at [facility] [address], [place], [place] [address]    Culture   Final    NO GROWTH < 24 HOURS Performed at [facility] [address], [place], [place] [address]    Report Status PENDING  Incomplete         Radiology Studies: US Renal  Result Date: 12/26/2018 CLINICAL DATA:  Acute kidney injury.  Urinary tract infection. EXAM: RENAL / URINARY TRACT ULTRASOUND COMPLETE COMPARISON:  None. FINDINGS: Right Kidney: Renal measurements: 0.0 x 5.1 x 4.7 cm = volume: 151.3 mL. There is some thinning of the normal renal parenchyma. Renal parenchyma is isoechoic to the index organ, the  liver. Liver is mildly hyperechoic. No focal lesions are present in the kidney. There is no stone or mass lesion. No hydronephrosis is present. Left Kidney: Renal measurements: 10.0 x 6.2 x 4.9 cm = volume: 159.3 mL. There is some thinning of the normal renal parenchyma. Parenchyma is isoechoic to the index organ, the spleen. No focal lesions are present. There is no stone or mass lesion. No hydronephrosis is present. Bladder: Heterogeneous mass lesion is noted in the posterior wall of bladder. This may represent fibrotic clot or calcified mass. This appears separate from the prostate gland. Other: None. IMPRESSION: 1. Acute abnormality.  No acute obstruction. 2. Fall renal parenchyma is hyperechoic bilaterally. This is nonspecific, but can be seen in the setting of medical renal disease. 3. Hyperechoic mass lesion the posterior wall of the urinary bladder. This may represent focal bladder wall mass versus fibrotic clot. Similar finding was noted mid other hemorrhage on the recent CT scan. Follow-up CT of the pelvis with contrast or MRI the pelvis without and with contrast could be useful for further evaluation if clinically indicated. Remaining hemorrhage within the bladder appears to have cleared. Electronically Signed   By: San Morelle M.D.   On: 12/26/2018 07:37   Dg Chest Portable 1 View  Result Date: 12/25/2018 CLINICAL DATA:  73 year old male with weakness. EXAM: PORTABLE CHEST 1 VIEW COMPARISON:  None. FINDINGS: The  heart size and mediastinal contours are within normal limits. Both lungs are clear. The visualized skeletal structures are unremarkable. IMPRESSION: No active disease. Electronically Signed   By: Anner Crete M.D.   On: 12/25/2018 16:01        Scheduled Meds: . Chlorhexidine Gluconate Cloth  6 each Topical Daily  . feeding supplement  1 Container Oral Q24H  . feeding supplement (ENSURE ENLIVE)  237 mL Oral BID BM  . feeding supplement (PRO-STAT SUGAR FREE 64)  30 mL Oral Daily  . heparin  5,000 Units Subcutaneous Q8H  . insulin aspart  0-5 Units Subcutaneous QHS  . insulin aspart  0-9 Units Subcutaneous TID WC  . lidocaine  1 application Urethral Once  . multivitamin with minerals  1 tablet Oral Daily  . nystatin cream   Topical BID   Continuous Infusions: . cefTRIAXone (ROCEPHIN)  IV 1 g (12/26/18 1943)     LOS: 2 days    Time spent: 25 mins.More than 50% of that time was spent in counseling and/or coordination of care.      Shelly Coss, MD [facility] Pager [phone]  If 7PM-7AM, please contact night-coverage [email or web address] Password [email or web address] 12/27/2018, 1:11 PM

## 2018-12-28 ENCOUNTER — Inpatient Hospital Stay (HOSPITAL_COMMUNITY): Payer: Medicare Other | Admitting: Physician Assistant

## 2018-12-28 ENCOUNTER — Ambulatory Visit (HOSPITAL_COMMUNITY): Admission: RE | Admit: 2018-12-28 | Payer: Medicare Other | Source: Home / Self Care | Admitting: Urology

## 2018-12-28 ENCOUNTER — Encounter (HOSPITAL_COMMUNITY)
Admission: EM | Disposition: A | Discharge status: Skilled Nursing Facility | Payer: Self-pay | Source: Home / Self Care | Attending: Internal Medicine

## 2018-12-28 ENCOUNTER — Encounter (HOSPITAL_COMMUNITY): Payer: Self-pay | Admitting: Emergency Medicine

## 2018-12-28 DIAGNOSIS — R627 Adult failure to thrive: Secondary | ICD-10-CM | POA: Diagnosis not present

## 2018-12-28 HISTORY — PX: TRANSURETHRAL RESECTION OF BLADDER TUMOR: SHX2575

## 2018-12-28 LAB — BASIC METABOLIC PANEL
Anion gap: 12 (ref 5–15)
BUN: 28 mg/dL — ABNORMAL HIGH (ref 8–23)
CO2: 20 mmol/L — ABNORMAL LOW (ref 22–32)
Calcium: 7.8 mg/dL — ABNORMAL LOW (ref 8.9–10.3)
Chloride: 102 mmol/L (ref 98–111)
Creatinine, Ser: 1.32 mg/dL — ABNORMAL HIGH (ref 0.61–1.24)
GFR calc Af Amer: >60 mL/min (ref >60)
GFR calc non Af Amer: 52 mL/min — ABNORMAL LOW (ref >60)
Glucose, Bld: 129 mg/dL — ABNORMAL HIGH (ref 70–99)
Potassium: 3.6 mmol/L (ref 3.5–5.1)
Sodium: 134 mmol/L — ABNORMAL LOW (ref 135–145)

## 2018-12-28 LAB — TYPE AND SCREEN
ABO/RH(D): B POS
Antibody Screen: NEGATIVE

## 2018-12-28 LAB — GLUCOSE, CAPILLARY
Glucose-Capillary: 138 mg/dL — ABNORMAL HIGH (ref 70–99)
Glucose-Capillary: 100 mg/dL — ABNORMAL HIGH (ref 70–99)
Glucose-Capillary: 104 mg/dL — ABNORMAL HIGH (ref 70–99)
Glucose-Capillary: 131 mg/dL — ABNORMAL HIGH (ref 70–99)
Glucose-Capillary: 205 mg/dL — ABNORMAL HIGH (ref 70–99)

## 2018-12-28 LAB — URINE CULTURE: Culture: >=100000 — AB

## 2018-12-28 LAB — CBC
HCT: 27.4 % — ABNORMAL LOW (ref 39.0–52.0)
Hemoglobin: 8.6 g/dL — ABNORMAL LOW (ref 13.0–17.0)
MCH: 30.1 pg (ref 26.0–34.0)
MCHC: 31.4 g/dL (ref 30.0–36.0)
MCV: 95.8 fL (ref 80.0–100.0)
Platelets: 301 10*3/uL (ref 150–400)
RBC: 2.86 MIL/uL — ABNORMAL LOW (ref 4.22–5.81)
RDW: 17.3 % — ABNORMAL HIGH (ref 11.5–15.5)
WBC: 12 10*3/uL — ABNORMAL HIGH (ref 4.0–10.5)
nRBC: 0 % (ref 0.0–0.2)

## 2018-12-28 LAB — ABO/RH: ABO/RH(D): B POS

## 2018-12-28 LAB — SURGICAL PCR SCREEN
MRSA, PCR: NEGATIVE
Staphylococcus aureus: POSITIVE — AB

## 2018-12-28 SURGERY — TURBT (TRANSURETHRAL RESECTION OF BLADDER TUMOR)
Anesthesia: General

## 2018-12-28 MED ORDER — ROCURONIUM BROMIDE 10 MG/ML (PF) SYRINGE
PREFILLED_SYRINGE | INTRAVENOUS | Status: DC | PRN
  Administered 2018-12-28: 50 mg via INTRAVENOUS

## 2018-12-28 MED ORDER — SUGAMMADEX SODIUM 200 MG/2ML IV SOLN
INTRAVENOUS | Status: DC | PRN
  Administered 2018-12-28: 200 mg via INTRAVENOUS

## 2018-12-28 MED ORDER — PROPOFOL 10 MG/ML IV BOLUS
INTRAVENOUS | Status: DC | PRN
  Administered 2018-12-28: 80 mg via INTRAVENOUS
  Administered 2018-12-28: 100 mg via INTRAVENOUS

## 2018-12-28 MED ORDER — 0.9 % SODIUM CHLORIDE (POUR BTL) OPTIME
TOPICAL | Status: DC | PRN
  Administered 2018-12-28: 1000 mL

## 2018-12-28 MED ORDER — ONDANSETRON HCL 4 MG/2ML IJ SOLN
INTRAMUSCULAR | Status: DC | PRN
  Administered 2018-12-28: 4 mg via INTRAVENOUS

## 2018-12-28 MED ORDER — CEFAZOLIN SODIUM-DEXTROSE 2-4 GM/100ML-% IV SOLN
2.0000 g | INTRAVENOUS | Status: AC
  Administered 2018-12-28: 15:00:00 2 g via INTRAVENOUS
  Filled 2018-12-28: qty 100

## 2018-12-28 MED ORDER — SODIUM CHLORIDE 0.9 % IV SOLN
INTRAVENOUS | Status: DC | PRN
  Administered 2018-12-28: 1000 mL via INTRAVENOUS

## 2018-12-28 MED ORDER — FENTANYL CITRATE (PF) 100 MCG/2ML IJ SOLN
25.0000 ug | INTRAMUSCULAR | Status: DC | PRN
  Administered 2018-12-28 (×2): 50 ug via INTRAVENOUS

## 2018-12-28 MED ORDER — FENTANYL CITRATE (PF) 100 MCG/2ML IJ SOLN
INTRAMUSCULAR | Status: DC | PRN
  Administered 2018-12-28 (×6): 50 ug via INTRAVENOUS

## 2018-12-28 MED ORDER — SODIUM CHLORIDE 0.9 % IR SOLN
Status: DC | PRN
  Administered 2018-12-28: 12000 mL

## 2018-12-28 MED ORDER — LACTATED RINGERS IV SOLN
INTRAVENOUS | Status: DC
  Administered 2018-12-28 (×2): via INTRAVENOUS

## 2018-12-28 MED ORDER — DEXAMETHASONE SODIUM PHOSPHATE 4 MG/ML IJ SOLN
INTRAMUSCULAR | Status: DC | PRN
  Administered 2018-12-28: 5 mg via INTRAVENOUS

## 2018-12-28 MED ORDER — LIDOCAINE 2% (20 MG/ML) 5 ML SYRINGE
INTRAMUSCULAR | Status: DC | PRN
  Administered 2018-12-28: 60 mg via INTRAVENOUS

## 2018-12-28 MED ORDER — ONDANSETRON HCL 4 MG/2ML IJ SOLN: 4.0000 mg | Freq: Once | INTRAMUSCULAR | Status: DC | PRN

## 2018-12-28 SURGICAL SUPPLY — 20 items
BAG DRN RND TRDRP ANRFLXCHMBR (UROLOGICAL SUPPLIES) ×1
BAG URINE DRAIN 2000ML AR STRL (UROLOGICAL SUPPLIES) ×2 IMPLANT
BAG URO CATCHER STRL LF (MISCELLANEOUS) ×3 IMPLANT
CATH FOLEY 2WAY SLVR  5CC 18FR (CATHETERS)
CATH FOLEY 2WAY SLVR  5CC 20FR (CATHETERS) ×2
CATH FOLEY 2WAY SLVR 5CC 18FR (CATHETERS) IMPLANT
CATH FOLEY 2WAY SLVR 5CC 20FR (CATHETERS) IMPLANT
ELECT REM PT RETURN 15FT ADLT (MISCELLANEOUS) ×1 IMPLANT
GLOVE BIO SURGEON STRL SZ7.5 (GLOVE) ×3 IMPLANT
GOWN STRL REUS W/TWL XL LVL3 (GOWN DISPOSABLE) ×3 IMPLANT
KIT TURNOVER KIT A (KITS) ×2 IMPLANT
LOOP CUT BIPOLAR 24F LRG (ELECTROSURGICAL) ×2 IMPLANT
MANIFOLD NEPTUNE II (INSTRUMENTS) ×3 IMPLANT
PACK CYSTO (CUSTOM PROCEDURE TRAY) ×3 IMPLANT
PENCIL SMOKE EVACUATOR (MISCELLANEOUS) IMPLANT
PLUG CATH AND CAP STER (CATHETERS) IMPLANT
SYRINGE IRR TOOMEY STRL 70CC (SYRINGE) ×2 IMPLANT
TUBING CONNECTING 10 (TUBING) ×2 IMPLANT
TUBING CONNECTING 10' (TUBING) ×1
TUBING UROLOGY SET (TUBING) ×3 IMPLANT

## 2018-12-28 NOTE — Anesthesia Preprocedure Evaluation (Addendum)
Anesthesia Evaluation  Patient identified by MRN, date of birth, ID band Patient awake    Reviewed: Allergy & Precautions, NPO status , Patient's Chart, lab work & pertinent test results  Airway Mallampati: III  TM Distance: >3 FB Neck ROM: Full    Dental  (+) Dental Advisory Given, Poor Dentition, Chipped, Missing   Pulmonary former smoker,    Pulmonary exam normal breath sounds clear to auscultation       Cardiovascular hypertension, Pt. on medications Normal cardiovascular exam Rhythm:Regular Rate:Normal     Neuro/Psych negative neurological ROS  negative psych ROS   GI/Hepatic negative GI ROS, Neg liver ROS,   Endo/Other  negative endocrine ROS  Renal/GU Renal InsufficiencyRenal disease   bladder cancer    Musculoskeletal negative musculoskeletal ROS (+)   Abdominal   Peds  Hematology  (+) Blood dyscrasia, anemia ,   Anesthesia Other Findings Day of surgery medications reviewed with the patient.  Reproductive/Obstetrics                            Anesthesia Physical Anesthesia Plan  ASA: III  Anesthesia Plan: General   Post-op Pain Management:    Induction: Intravenous  PONV Risk Score and Plan: 3 and Dexamethasone and Ondansetron  Airway Management Planned: Oral ETT and Video Laryngoscope Planned  Additional Equipment:   Intra-op Plan:   Post-operative Plan: Extubation in OR  Informed Consent: I have reviewed the patients History and Physical, chart, labs and discussed the procedure including the risks, benefits and alternatives for the proposed anesthesia with the patient or authorized representative who has indicated his/her understanding and acceptance.     Dental advisory given  Plan Discussed with: CRNA  Anesthesia Plan Comments:        Anesthesia Quick Evaluation

## 2018-12-28 NOTE — Progress Notes (Signed)
Pt taken off unit to OR at this time.

## 2018-12-28 NOTE — Interval H&P Note (Signed)
History and Physical Interval Note:  12/28/2018 2:12 PM  [name].  has presented today for surgery, with the diagnosis of bladder cancer.  The various methods of treatment have been discussed with the patient and family. After consideration of risks, benefits and other options for treatment, the patient has consented to  Procedure(s): TRANSURETHRAL RESECTION OF BLADDER TUMOR (TURBT) (N/A) as a surgical intervention.  The patient's history has been reviewed, patient examined, no change in status, stable for surgery.  I have reviewed the patient's chart and labs.  Questions were answered to the patient's satisfaction.     Marton Redwood, III

## 2018-12-28 NOTE — Progress Notes (Signed)
Nutrition Follow-up  DOCUMENTATION CODES:   Not applicable  INTERVENTION:  - once diet re-advanced, continue Ensure Enlive BID, Boost Breeze once/day, and 30 ml prostat once/day.  - will continue to monitor for additional nutrition-related needs.   NUTRITION DIAGNOSIS:   Increased nutrient needs related to catabolic illness, cancer and cancer related treatments, acute illness as evidenced by estimated needs. -ongoing  GOAL:   Patient will meet greater than or equal to 90% of their needs -improving/progressing  MONITOR:   PO intake, Supplement acceptance, Labs, Weight trends, I & O's  ASSESSMENT:   73 year old male with history of HTN, DM, stage 3 CKD. He recently (12/07/18) underwent transurethral resection of bladder tumor that was found to be malignant.  Biopsy showed high-grade papillary urothelial carcinoma. Patient lives alone at home.  Patient reported that he was very weak, unable to get up.  EMS found him covered in urine and feces. In the ED he was diagnosed with leukocytosis, hypokalemia, AKI. Urology has been consulted.  Per review of orders, patient has been accepting nearly all oral nutrition supplements offered (consuming 100% of all supplements ordered would provide 1050 kcal, 64 grams protein).   Intakes from meals yesterday: Breakfast: 25%--136 kcal, 1 gram protein Lunch: 100%--382 kcal, 10 grams protein Dinner: 50%--228 kcal, 8 grams protein Total: 746 kcal, 19 grams protein  Patient has been NPO since midnight and did not receiving breakfast or lunch today.    Labs reviewed; CBGs: 138 and 100 mg/day, Na: 134 mmol/l, BUN: 28 mg/dl, creatinine: 1.32 mg/dl, Ca: 7.8 mg/dl, GFR: 52 ml/min. Medications reviewed; sliding scale novolog, daily multivitamin with minerals.     NUTRITION - FOCUSED PHYSICAL EXAM:  completed; no muscle or fat wasting.   Diet Order:   Diet Order            Diet NPO time specified  Diet effective midnight        Diet NPO time  specified  Diet effective midnight              EDUCATION NEEDS:   No education needs have been identified at this time  Skin:  Skin Assessment: Reviewed RN Assessment  Last BM:  12/3  Height:   Ht Readings from Last 1 Encounters:  12/25/18 6\' 1"  (1.854 m)    Weight:   Wt Readings from Last 1 Encounters:  12/25/18 94.3 kg    Ideal Body Weight:  83.6 kg  BMI:  Body mass index is 27.43 kg/m.  Estimated Nutritional Needs:   Kcal:  2200-2400 kcal  Protein:  115-125 grams  Fluid:  >/= 2.2 L/day      Mark Matin, MS, RD, LDN, [facility] Inpatient Clinical Dietitian Pager # [phone] After hours/weekend pager # [phone]

## 2018-12-28 NOTE — Anesthesia Procedure Notes (Signed)
Procedure Name: Intubation Date/Time: 12/28/2018 2:53 PM Performed by: Claudia Desanctis, CRNA Pre-anesthesia Checklist: Patient identified, Emergency Drugs available, Suction available and Patient being monitored Patient Re-evaluated:Patient Re-evaluated prior to induction Oxygen Delivery Method: Circle system utilized Preoxygenation: Pre-oxygenation with 100% oxygen Induction Type: IV induction Ventilation: Mask ventilation without difficulty Laryngoscope Size: 2 and Miller Grade View: Grade II Tube type: Oral Tube size: 7.5 mm Number of attempts: 1 Airway Equipment and Method: Stylet Placement Confirmation: ETT inserted through vocal cords under direct vision,  positive ETCO2 and breath sounds checked- equal and bilateral Secured at: 22 cm Tube secured with: Tape Dental Injury: Teeth and Oropharynx as per pre-operative assessment  Difficulty Due To: Difficult Airway- due to anterior larynx and Difficult Airway- due to dentition

## 2018-12-28 NOTE — Progress Notes (Signed)
PROGRESS NOTE    [name].  RP:[id number] DOB: [birth year] DOA: 12/25/2018 PCP: Katherina Mires, MD   Brief Narrative:  Patient is a 73 year old male with history of hypertension, diabetes, CKD stage III who just underwent transurethral resection of bladder tumor that was found to be malignant on December 07, 2018.  Biopsy showed high-grade papillary urothelial carcinoma.  He was discharged on November 14/2020.  Patient lives alone at home.  Patient reported that he was very weak, unable to get up and was not being.  EMS found him covered in urine and feces.  When he presented he was found to have leukocytosis, hypokalemia, acute kidney injury.  Urinalysis was suggestive  of urinary tract infection.  Urine culture, blood culture sent.  Started on IV antibiotics. His kidney function improved after Foley catheter placement.  PT evaluated him and recommended skilled facility on discharge. Urology planning for transurethral resection of bladder tumor today. He will be ready for discharge to skilled nursing facility after urology clearance.  Assessment & Plan:   Active Problems:   FTT (failure to thrive) in adult   Urinary tract infection: Recent history of transurethral resection of bladder tumor.  Urine culture showing significant Proteus vulgaris.  Blood culture has not shown any growth till date.  Started on ceftriaxone.  Hemodynamically stable at present.  AKI on CKD stage III: His creatinine was 1.2 on 11/14.  Presented with creatinine of more than 2.  Possible  urinary obstruction.  Foley placed with drainage of bloody urine.  Ultrasound of the kidneys did not show any obvious blockage.  Kidney function improving and now close to baseline.  Bladder cancer: underwent transurethral resection of bladder tumor  on December 07, 2018.  Biopsy showed high-grade papillary urothelial carcinoma.  Urology has been following and planning for transurethral resection of bladder tumor again today.   Failure to thrive/deconditioning/debility:.  PT/OT evaluation done.  Recommended skilled nursing facility.  Case manager following.  Hypokalemia: Supplemented.  Home hydrochlorothiazide on hold.  Prolonged QTc interval: QTC was 530 on presentation.Improved to 469.  Nutrition Problem: Increased nutrient needs Etiology: catabolic illness, cancer and cancer related treatments, acute illness      DVT prophylaxis: Heparin Code Status: Full code Family Communication: None present at the bedside Disposition Plan: Skilled nursing facility after urology clearance   Consultants: Urology  Procedures: Foley catheter placement  Antimicrobials:  Anti-infectives (From admission, onward)   Start     Dose/Rate Route Frequency Ordered Stop   12/28/18 0920  ceFAZolin (ANCEF) IVPB 2g/100 mL premix     2 g 200 mL/hr over 30 Minutes Intravenous 30 min pre-op 12/28/18 0921     12/26/18 2000  [MAR Hold]  cefTRIAXone (ROCEPHIN) 1 g in sodium chloride 0.9 % 100 mL IVPB     (MAR Hold since Fri 12/28/2018 at 1349.Hold Reason: Transfer to a Procedural area.)   1 g 200 mL/hr over 30 Minutes Intravenous Every 24 hours 12/25/18 2248     12/25/18 2000  cefTRIAXone (ROCEPHIN) 1 g in sodium chloride 0.9 % 100 mL IVPB     1 g 200 mL/hr over 30 Minutes Intravenous  Once 12/25/18 1947 12/25/18 2108      Subjective:  Patient seen and examined the bedside this morning.  Hemodynamically stable.  He feels much better today.  Denies any new complaints.Awaiting urology procedure.  Objective: Vitals:   12/28/18 0611 12/28/18 1246 12/28/18 1357 12/28/18 1404  BP: 139/76 127/66 120/63   Pulse: 78 84  89   Resp: 18 18 18    Temp: 97.8 F (36.6 C) 98.2 F (36.8 C) 98.7 F (37.1 C)   TempSrc: Oral  Oral   SpO2: 96% 99% 97%   Weight:    94.3 kg  Height:    6\' 1"  (1.854 m)    Intake/Output Summary (Last 24 hours) at 12/28/2018 1423 Last data filed at 12/28/2018 1100 Gross per 24 hour  Intake 700 ml  Output 500  ml  Net 200 ml   Filed Weights   12/25/18 2248 12/28/18 1404  Weight: 94.3 kg 94.3 kg    Examination:  General exam: Comfortable this morning HEENT:PERRL, Ear/Nose normal on gross exam Respiratory system: Bilateral equal air entry, normal vesicular breath sounds, no wheezes or crackles  Cardiovascular system: S1 & S2 heard, RRR. No JVD, murmurs, rubs, gallops or clicks. No pedal edema. Gastrointestinal system: Abdomen is nondistended, soft and nontender. No organomegaly or masses felt. Normal bowel sounds heard. Central nervous system: Alert and oriented. No focal neurological deficits. Extremities: No edema, no clubbing ,no cyanosis Skin: no icterus ,no pallor.  Excoriation/erythematous changes on left groin GU: Foley    Data Reviewed: I have personally reviewed following labs and imaging studies  CBC: Recent Labs  Lab 12/25/18 1513 12/25/18 2304 12/26/18 0457 12/27/18 0452 12/28/18 0942  WBC 15.8* 13.9* 13.4* 9.3 12.0*  NEUTROABS 12.9*  --   --  6.9  --   HGB 10.7* 9.5* 9.3* 8.6* 8.6*  HCT 33.3* 29.5* 29.5* 27.9* 27.4*  MCV 94.3 95.2 94.9 97.2 95.8  PLT 492* 443* 416* 310 [id number]   Basic Metabolic Panel: Recent Labs  Lab 12/25/18 1513 12/25/18 2304 12/26/18 0457 12/27/18 0452 12/28/18 0536  NA 134* 134* 135 136 134*  K 2.7* 3.3* 3.9 3.9 3.6  CL 93* 96* 101 104 102  CO2 25 25 23  21* 20*  GLUCOSE 224* 164* 177* 123* 129*  BUN 62* 57* 56* 38* 28*  CREATININE 2.48* 2.23* 2.29* 1.78* 1.32*  CALCIUM 8.8* 8.0* 8.0* 8.0* 7.8*  MG  --  2.3 2.3  --   --   PHOS  --  2.5  --   --   --    GFR: Estimated Creatinine Clearance: 54.6 mL/min (A) (by C-G formula based on SCr of 1.32 mg/dL (H)). Liver Function Tests: Recent Labs  Lab 12/25/18 1513  AST 25  ALT 31  ALKPHOS 123  BILITOT 2.6*  PROT 7.4  ALBUMIN 2.7*   No results for input(s): LIPASE, AMYLASE in the last 168 hours. No results for input(s): AMMONIA in the last 168 hours. Coagulation Profile: No results  for input(s): INR, PROTIME in the last 168 hours. Cardiac Enzymes: Recent Labs  Lab 12/25/18 1513  CKTOTAL 33*   BNP (last 3 results) No results for input(s): PROBNP in the last 8760 hours. HbA1C: No results for input(s): HGBA1C in the last 72 hours. CBG: Recent Labs  Lab 12/27/18 1657 12/27/18 2220 12/28/18 0814 12/28/18 1147 12/28/18 1355  GLUCAP 138* 171* 138* 100* 104*   Lipid Profile: No results for input(s): CHOL, HDL, LDLCALC, TRIG, CHOLHDL, LDLDIRECT in the last 72 hours. Thyroid Function Tests: Recent Labs    12/25/18 2304  TSH 2.639   Anemia Panel: No results for input(s): VITAMINB12, FOLATE, FERRITIN, TIBC, IRON, RETICCTPCT in the last 72 hours. Sepsis Labs: No results for input(s): PROCALCITON, LATICACIDVEN in the last 168 hours.  Recent Results (from the past 240 hour(s))  Urine culture     Status:  Abnormal   Collection Time: 12/25/18  6:43 PM   Specimen: Urine, Clean Catch  Result Value Ref Range Status   Specimen Description   Final    URINE, CLEAN CATCH Performed at [facility], [place] [address], [place], [place] [address]    Special Requests   Final    NONE Performed at [facility], [place] [address] [place], [place] [address]    Culture >=100,000 COLONIES/mL PROTEUS VULGARIS (A)  Final   Report Status 12/28/2018 FINAL  Final   Organism ID, Bacteria PROTEUS VULGARIS (A)  Final      Susceptibility   Proteus vulgaris - MIC*    AMPICILLIN >=32 RESISTANT Resistant     CEFAZOLIN >=64 RESISTANT Resistant     CEFTRIAXONE <=1 SENSITIVE Sensitive     CIPROFLOXACIN <=0.25 SENSITIVE Sensitive     GENTAMICIN <=1 SENSITIVE Sensitive     IMIPENEM 4 SENSITIVE Sensitive     NITROFURANTOIN 128 RESISTANT Resistant     TRIMETH/SULFA <=20 SENSITIVE Sensitive     AMPICILLIN/SULBACTAM 16 INTERMEDIATE Intermediate     PIP/TAZO <=4 SENSITIVE Sensitive     * >=100,000 COLONIES/mL PROTEUS VULGARIS  SARS CORONAVIRUS 2 (TAT 6-24  HRS) Nasopharyngeal Nasopharyngeal Swab     Status: None   Collection Time: 12/25/18  7:59 PM   Specimen: Nasopharyngeal Swab  Result Value Ref Range Status   SARS Coronavirus 2 NEGATIVE NEGATIVE Final    Comment: (NOTE) SARS-CoV-2 target nucleic acids are NOT DETECTED. The SARS-CoV-2 RNA is generally detectable in upper and lower respiratory specimens during the acute phase of infection. Negative results do not preclude SARS-CoV-2 infection, do not rule out co-infections with other pathogens, and should not be used as the sole basis for treatment or other patient management decisions. Negative results must be combined with clinical observations, patient history, and epidemiological information. The expected result is Negative. Fact Sheet for Patients: SugarRoll.be Fact Sheet for Healthcare Providers: https://www.woods-mathews.com/ This test is not yet approved or cleared by the [place] FDA and  has been authorized for detection and/or diagnosis of SARS-CoV-2 by FDA under an Emergency Use Authorization (EUA). This EUA will remain  in effect (meaning this test can be used) for the duration of the COVID-19 declaration under Section 56 4(b)(1) of the Act, 21 U.S.C. section 360bbb-3(b)(1), unless the authorization is terminated or revoked sooner. Performed at [facility], [place] [address] [place], [place] [address]   Culture, blood (routine x 2)     Status: None (Preliminary result)   Collection Time: 12/26/18  8:37 AM   Specimen: BLOOD  Result Value Ref Range Status   Specimen Description   Final    BLOOD LEFT ANTECUBITAL Performed at [facility] [address], [place], [place] [address]    Special Requests   Final    BOTTLES DRAWN AEROBIC AND ANAEROBIC Blood Culture adequate volume Performed at [facility] [address], [place], [place] [address]    Culture   Final    NO GROWTH 2  DAYS Performed at [facility] [address], [place], [place] [address]    Report Status PENDING  Incomplete  Culture, blood (routine x 2)     Status: None (Preliminary result)   Collection Time: 12/26/18  8:44 AM   Specimen: BLOOD  Result Value Ref Range Status   Specimen Description   Final    BLOOD RIGHT ARM Performed at [facility]  [address] [place], [place] [address]    Special Requests   Final    BOTTLES DRAWN AEROBIC AND ANAEROBIC Blood Culture results may not be optimal due to an inadequate volume of blood received in culture bottles Performed at [facility] [address], [place], [place] [address]    Culture   Final    NO GROWTH 2 DAYS Performed at [facility] [address], [place], [place] [address]    Report Status PENDING  Incomplete         Radiology Studies: No results found.      Scheduled Meds: . [MAR Hold] Chlorhexidine Gluconate Cloth  6 each Topical Daily  . [MAR Hold] feeding supplement  1 Container Oral Q24H  . [MAR Hold] feeding supplement (ENSURE ENLIVE)  237 mL Oral BID BM  . [MAR Hold] feeding supplement (PRO-STAT SUGAR FREE 64)  30 mL Oral Daily  . [MAR Hold] heparin  5,000 Units Subcutaneous Q8H  . [MAR Hold] insulin aspart  0-5 Units Subcutaneous QHS  . [MAR Hold] insulin aspart  0-9 Units Subcutaneous TID WC  . [MAR Hold] lidocaine  1 application Urethral Once  . [MAR Hold] multivitamin with minerals  1 tablet Oral Daily  . [MAR Hold] nystatin cream   Topical BID   Continuous Infusions: .  ceFAZolin (ANCEF) IV    . [MAR Hold] cefTRIAXone (ROCEPHIN)  IV 1 g (12/28/18 0022)  . lactated ringers 50 mL/hr at 12/28/18 1407     LOS: 3 days    Time spent: 25 mins.More than 50% of that time was spent in counseling and/or coordination of care.      Shelly Coss, MD [facility] Pager [phone]  If 7PM-7AM, please contact night-coverage [email or web address]  Password [email or web address] 12/28/2018, 2:23 PM

## 2018-12-28 NOTE — Transfer of Care (Signed)
Immediate Anesthesia Transfer of Care Note  Patient: [name].  Procedure(s) Performed: TRANSURETHRAL RESECTION OF BLADDER TUMOR (TURBT) (N/A )  Patient Location: PACU  Anesthesia Type:General  Level of Consciousness: awake and patient cooperative  Airway & Oxygen Therapy: Patient Spontanous Breathing and Patient connected to face mask  Post-op Assessment: Report given to RN and Post -op Vital signs reviewed and stable  Post vital signs: Reviewed and stable  Last Vitals:  Vitals Value Taken Time  BP 145/80 12/28/18 1606  Temp    Pulse 87 12/28/18 1608  Resp 14 12/28/18 1608  SpO2 96 % 12/28/18 1608  Vitals shown include unvalidated device data.  Last Pain:  Vitals:   12/28/18 1404  TempSrc:   PainSc: 0-No pain      Patients Stated Pain Goal: 4 ([id number])  Complications: No apparent anesthesia complications

## 2018-12-28 NOTE — Op Note (Signed)
Operative Note  Preoperative diagnosis:  1.  Bladder cancer  Postoperative diagnosis: 1.  Bladder cancer  Procedure(s): 1.  Transurethral resection of bladder tumor--medium  Surgeon: Link Snuffer, MD  Assistants: None  Anesthesia: General  Complications: None immediate  EBL: Minimal  Specimens: 1.  Bladder tumor/biopsy  Drains/Catheters: 1.  20 French Foley catheter  Intraoperative findings: 1.  Normal urethra 2.  At the posterior bladder neck/trigone, there was appearance of possible perforation into the perivesical fat.  However, the bladder held water well.  There was no evidence of any leakage of water throughout the case.  Upon inspection the bladder there was a great deal of edema of the mucosa.  However, there was no obvious bladder tumor.  I resected some of the edematous tissue on the right lateral wall and anterior wall and sent that for specimen.  Bilateral ureteral orifices were not able to be seen.  Indication: 73 year old male recently diagnosed with T1 bladder cancer presents for the previously mentioned operation.  Description of procedure:  The patient was identified and consent was obtained.  The patient was taken to the operating room and placed in the supine position.  The patient was placed under general anesthesia.  Perioperative antibiotics were administered.  The patient was placed in dorsal lithotomy.  Patient was prepped and draped in a standard sterile fashion and a timeout was performed.  A 26 French resectoscope with a visual obturator in place was advanced into the urethra and into the bladder.  This was exchanged for the bipolar working element.  On bipolar settings, the areas of interest were resected.  I did note there to be a defect in the posterior bladder wall from either prior surgery or from placement of the Foley catheter upon presentation to the hospital.  There was no evidence of any leakage of water from the bladder.  I collected the bladder  chips specimens.  The bladder held all of the irrigant.  Therefore, given the defect, I decided to leave a 20 [place] Foley catheter.  Plan: Follow-up in 1 week for catheter removal and review of pathology

## 2018-12-28 NOTE — TOC Progression Note (Signed)
Transition of Care (TOC) - Progression Note    Patient Details  Name: [name]. MRN: [id number] Date of Birth: September 08, 1945  Transition of Care [facility]) CM/SW Contact  Mariachristina Holle, Juliann Pulse, RN Phone Number: 12/28/2018, 10:44 AM  Clinical Narrative:  No d/c today. CP will accept over weekend if d/c.--rep Bryson Ha following. MD aware to order COVID. auth eff 124-12/8.     Expected Discharge Plan: [place] Barriers to Discharge: Continued Medical Work up  Expected Discharge Plan and Services Expected Discharge Plan: [place]   Discharge Planning Services: CM Consult   Living arrangements for the past 2 months: Single Family Home                                       Social Determinants of Health (SDOH) Interventions    Readmission Risk Interventions No flowsheet data found.

## 2018-12-28 NOTE — Care Management Important Message (Signed)
Important Message  Patient Details IM Letter given to Dessa Phi RN to present to the Patient Name: [name]. MRN: [id number] Date of Birth: 08/03/1945   Medicare Important Message Given:  Yes     Kerin Salen 12/28/2018, 12:50 PM

## 2018-12-29 DIAGNOSIS — R627 Adult failure to thrive: Secondary | ICD-10-CM | POA: Diagnosis not present

## 2018-12-29 LAB — GLUCOSE, CAPILLARY
Glucose-Capillary: 131 mg/dL — ABNORMAL HIGH (ref 70–99)
Glucose-Capillary: 162 mg/dL — ABNORMAL HIGH (ref 70–99)

## 2018-12-29 LAB — BASIC METABOLIC PANEL
Anion gap: 12 (ref 5–15)
BUN: 22 mg/dL (ref 8–23)
CO2: 22 mmol/L (ref 22–32)
Calcium: 8.2 mg/dL — ABNORMAL LOW (ref 8.9–10.3)
Chloride: 100 mmol/L (ref 98–111)
Creatinine, Ser: 1.27 mg/dL — ABNORMAL HIGH (ref 0.61–1.24)
GFR calc Af Amer: >60 mL/min (ref >60)
GFR calc non Af Amer: 55 mL/min — ABNORMAL LOW (ref >60)
Glucose, Bld: 149 mg/dL — ABNORMAL HIGH (ref 70–99)
Potassium: 4.2 mmol/L (ref 3.5–5.1)
Sodium: 134 mmol/L — ABNORMAL LOW (ref 135–145)

## 2018-12-29 LAB — CBC WITH DIFFERENTIAL/PLATELET
Abs Immature Granulocytes: 0.33 10*3/uL — ABNORMAL HIGH (ref 0.00–0.07)
Basophils Absolute: 0 10*3/uL (ref 0.0–0.1)
Basophils Relative: 0 %
Eosinophils Absolute: 0 10*3/uL (ref 0.0–0.5)
Eosinophils Relative: 0 %
HCT: 29.2 % — ABNORMAL LOW (ref 39.0–52.0)
Hemoglobin: 8.9 g/dL — ABNORMAL LOW (ref 13.0–17.0)
Immature Granulocytes: 3 %
Lymphocytes Relative: 9 %
Lymphs Abs: 1.2 10*3/uL (ref 0.7–4.0)
MCH: 29.2 pg (ref 26.0–34.0)
MCHC: 30.5 g/dL (ref 30.0–36.0)
MCV: 95.7 fL (ref 80.0–100.0)
Monocytes Absolute: 0.9 10*3/uL (ref 0.1–1.0)
Monocytes Relative: 7 %
Neutro Abs: 11 10*3/uL — ABNORMAL HIGH (ref 1.7–7.7)
Neutrophils Relative %: 81 %
Platelets: 286 10*3/uL (ref 150–400)
RBC: 3.05 MIL/uL — ABNORMAL LOW (ref 4.22–5.81)
RDW: 17.2 % — ABNORMAL HIGH (ref 11.5–15.5)
WBC: 13.4 10*3/uL — ABNORMAL HIGH (ref 4.0–10.5)
nRBC: 0 % (ref 0.0–0.2)

## 2018-12-29 LAB — SARS CORONAVIRUS 2 (TAT 6-24 HRS): SARS Coronavirus 2: NEGATIVE

## 2018-12-29 MED ORDER — CEFDINIR 300 MG PO CAPS: 300.0000 mg | ORAL_CAPSULE | Freq: Two times a day (BID) | ORAL | 0 refills | Status: AC

## 2018-12-29 NOTE — Progress Notes (Signed)
Updated patients contact person Marcelino Duster @[phone] that patient is transferring to [facility]) today.

## 2018-12-29 NOTE — Discharge Summary (Signed)
Physician Discharge Summary  [name]. [id number] DOB: 05-07-1945 DOA: 12/25/2018  PCP: Katherina Mires, MD  Admit date: 12/25/2018 Discharge date: 12/29/2018  Admitted From: Home Disposition: SNF Discharge Condition:Stable CODE STATUS:FULL Diet recommendation: Heart Healthy   Brief/Interim Summary:  Patient is a 73 year old male with history of hypertension, diabetes, CKD stage III who just underwent transurethral resection of bladder tumor that was found to be malignant on December 07, 2018.  Biopsy showed high-grade papillary urothelial carcinoma.  He was discharged on November 14/2020.  Patient lives alone at home.  Patient reported that he was very weak, unable to get up and was not being.  EMS found him covered in urine and feces.  When he presented he was found to have leukocytosis, hypokalemia, acute kidney injury.  Urinalysis was suggestive  of urinary tract infection.tarted on IV antibiotics. His kidney function improved after Foley catheter placement.    Urine culture showed Proteus vulgaris.  Antibiotics changed to oral today.  He underwent transureteral resection of bladder tumor by urology on 12/28/2018.  Urology cleared him for discharge.  He will go with Foley catheter.  He will follow-up with urology in a week for the removal of Foley catheter.  PT evaluated him and recommended skilled facility on discharge.  Patient is hemodynamically stable for discharge to skilled nursing facility today.  Following problems were addressed during his hospitalization.   Urinary tract infection: Recent history of transurethral resection of bladder tumor.  Urine culture showing significant Proteus vulgaris.  Blood culture has not shown any growth till date.  Started on ceftriaxone,changed to [employer] today.  Hemodynamically stable at present.  AKI on CKD stage III: His creatinine was 1.2 on 11/14.  Presented with creatinine of more than 2.  Possible  urinary obstruction.  Foley placed with  drainage of bloody urine.  Ultrasound of the kidneys did not show any obvious blockage.  Kidney function improving and now close to baseline.  Bladder cancer: underwent transurethral resection of bladder tumor  on December 07, 2018.  Biopsy showed high-grade papillary urothelial carcinoma.  Urology has been following and he again underwent  transurethral resection of bladder tumor on 12/28/18.  Failure to thrive/deconditioning/debility:  PT/OT evaluation done.  Recommended skilled nursing facility.  Case manager following.  Hypokalemia: Supplemented.  Home hydrochlorothiazide on hold.  Prolonged QTc interval: QTC was 530 on presentation.Improved to 469.  Nutrition Problem: Increased nutrient needs Etiology: catabolic illness, cancer and cancer related treatments, acute illness  Discharge Diagnoses:  Active Problems:   FTT (failure to thrive) in adult    Discharge Instructions  Discharge Instructions    Diet - low sodium heart healthy   Complete by: As directed    Discharge instructions   Complete by: As directed    1)Please do a CBC and BMP tests in a week. 2)Follow up with urology in a week for the removal of Foley catheter.  Name and number the provider has been attached. 3)Take prescribed  medications as instructed.   Increase activity slowly   Complete by: As directed      Allergies as of 12/29/2018   No Known Allergies     Medication List    STOP taking these medications   HYDROcodone-acetaminophen 5-325 MG tablet Commonly known as: Norco     TAKE these medications   cefdinir 300 MG capsule Commonly known as: OMNICEF Take 1 capsule (300 mg total) by mouth 2 (two) times daily for 1 day.   citalopram 20 MG tablet  Commonly known as: CELEXA Take 20 mg by mouth daily.   fluticasone 50 MCG/ACT nasal spray Commonly known as: FLONASE Place 2 sprays into both nostrils daily as needed for allergies.   hydroxypropyl methylcellulose / hypromellose 2.5 % ophthalmic  solution Commonly known as: ISOPTO TEARS / GONIOVISC Place 1 drop into both eyes 3 (three) times daily as needed for dry eyes.   lisinopril-hydrochlorothiazide 10-12.5 MG tablet Commonly known as: ZESTORETIC Take 1 tablet by mouth daily.   metFORMIN 500 MG 24 hr tablet Commonly known as: GLUCOPHAGE-XR Take 500 mg by mouth daily with breakfast.   oxybutynin 5 MG tablet Commonly known as: DITROPAN Take 1 tablet (5 mg total) by mouth every 8 (eight) hours as needed for bladder spasms.       Contact information for follow-up providers    Lucas Mallow, MD. Schedule an appointment as soon as possible for a visit in 1 week(s).   Specialty: Urology Contact information: [place] [address] [phone]            Contact information for after-discharge care    Destination    [facility] .   Service: Skilled Nursing Contact information: [address] [place] [phone]                 No Known Allergies  Consultations:  Urology   Procedures/Studies: US Renal  Result Date: 12/26/2018 CLINICAL DATA:  Acute kidney injury.  Urinary tract infection. EXAM: RENAL / URINARY TRACT ULTRASOUND COMPLETE COMPARISON:  None. FINDINGS: Right Kidney: Renal measurements: 0.0 x 5.1 x 4.7 cm = volume: 151.3 mL. There is some thinning of the normal renal parenchyma. Renal parenchyma is isoechoic to the index organ, the liver. Liver is mildly hyperechoic. No focal lesions are present in the kidney. There is no stone or mass lesion. No hydronephrosis is present. Left Kidney: Renal measurements: 10.0 x 6.2 x 4.9 cm = volume: 159.3 mL. There is some thinning of the normal renal parenchyma. Parenchyma is isoechoic to the index organ, the spleen. No focal lesions are present. There is no stone or mass lesion. No hydronephrosis is present. Bladder: Heterogeneous mass lesion is noted in the posterior wall of bladder.  This may represent fibrotic clot or calcified mass. This appears separate from the prostate gland. Other: None. IMPRESSION: 1. Acute abnormality.  No acute obstruction. 2. Fall renal parenchyma is hyperechoic bilaterally. This is nonspecific, but can be seen in the setting of medical renal disease. 3. Hyperechoic mass lesion the posterior wall of the urinary bladder. This may represent focal bladder wall mass versus fibrotic clot. Similar finding was noted mid other hemorrhage on the recent CT scan. Follow-up CT of the pelvis with contrast or MRI the pelvis without and with contrast could be useful for further evaluation if clinically indicated. Remaining hemorrhage within the bladder appears to have cleared. Electronically Signed   By: San Morelle M.D.   On: 12/26/2018 07:37   Dg Chest Portable 1 View  Result Date: 12/25/2018 CLINICAL DATA:  73 year old male with weakness. EXAM: PORTABLE CHEST 1 VIEW COMPARISON:  None. FINDINGS: The heart size and mediastinal contours are within normal limits. Both lungs are clear. The visualized skeletal structures are unremarkable. IMPRESSION: No active disease. Electronically Signed   By: Anner Crete M.D.   On: 12/25/2018 16:01       Subjective: Patient seen and examined the bedside this morning.  Hemodynamically stable for discharge  today.  Discharge Exam: Vitals:   12/28/18 2120 12/29/18 0527  BP: 135/71 124/73  Pulse: 96 83  Resp: 20 18  Temp: 98.5 F (36.9 C) 98.3 F (36.8 C)  SpO2: 99% 98%   Vitals:   12/28/18 1645 12/28/18 1704 12/28/18 2120 12/29/18 0527  BP: 127/69 137/76 135/71 124/73  Pulse: 80 83 96 83  Resp: 14 18 20 18   Temp: 97.6 F (36.4 C) (!) 97.4 F (36.3 C) 98.5 F (36.9 C) 98.3 F (36.8 C)  TempSrc:  Oral Oral Oral  SpO2: 95% 96% 99% 98%  Weight:    101.8 kg  Height:        General: Pt is alert, awake, not in acute distress Cardiovascular: RRR, S1/S2 +, no rubs, no gallops Respiratory: CTA bilaterally,  no wheezing, no rhonchi Abdominal: Soft, NT, ND, bowel sounds + Extremities: no edema, no cyanosis GU: Foley    The results of significant diagnostics from this hospitalization (including imaging, microbiology, ancillary and laboratory) are listed below for reference.     Microbiology: Recent Results (from the past 240 hour(s))  Urine culture     Status: Abnormal   Collection Time: 12/25/18  6:43 PM   Specimen: Urine, Clean Catch  Result Value Ref Range Status   Specimen Description   Final    URINE, CLEAN CATCH Performed at [facility], [place] [address], [place], [place] [address]    Special Requests   Final    NONE Performed at [facility], [place] [address], [place], [place] [address]    Culture >=100,000 COLONIES/mL PROTEUS VULGARIS (A)  Final   Report Status 12/28/2018 FINAL  Final   Organism ID, Bacteria PROTEUS VULGARIS (A)  Final      Susceptibility   Proteus vulgaris - MIC*    AMPICILLIN >=32 RESISTANT Resistant     CEFAZOLIN >=64 RESISTANT Resistant     CEFTRIAXONE <=1 SENSITIVE Sensitive     CIPROFLOXACIN <=0.25 SENSITIVE Sensitive     GENTAMICIN <=1 SENSITIVE Sensitive     IMIPENEM 4 SENSITIVE Sensitive     NITROFURANTOIN 128 RESISTANT Resistant     TRIMETH/SULFA <=20 SENSITIVE Sensitive     AMPICILLIN/SULBACTAM 16 INTERMEDIATE Intermediate     PIP/TAZO <=4 SENSITIVE Sensitive     * >=100,000 COLONIES/mL PROTEUS VULGARIS  SARS CORONAVIRUS 2 (TAT 6-24 HRS) Nasopharyngeal Nasopharyngeal Swab     Status: None   Collection Time: 12/25/18  7:59 PM   Specimen: Nasopharyngeal Swab  Result Value Ref Range Status   SARS Coronavirus 2 NEGATIVE NEGATIVE Final    Comment: (NOTE) SARS-CoV-2 target nucleic acids are NOT DETECTED. The SARS-CoV-2 RNA is generally detectable in upper and lower respiratory specimens during the acute phase of infection. Negative results do not preclude SARS-CoV-2 infection, do not rule out co-infections  with other pathogens, and should not be used as the sole basis for treatment or other patient management decisions. Negative results must be combined with clinical observations, patient history, and epidemiological information. The expected result is Negative. Fact Sheet for Patients: SugarRoll.be Fact Sheet for Healthcare Providers: https://www.woods-mathews.com/ This test is not yet approved or cleared by the [place] FDA and  has been authorized for detection and/or diagnosis of SARS-CoV-2 by FDA under an Emergency Use Authorization (EUA). This EUA will remain  in effect (meaning this test can be used) for the duration of the COVID-19 declaration under Section 56 4(b)(1) of the Act, 21 U.S.C. section 360bbb-3(b)(1), unless the authorization is terminated or  revoked sooner. Performed at [facility], [place] [address] [place], [place] [address]   Culture, blood (routine x 2)     Status: None (Preliminary result)   Collection Time: 12/26/18  8:37 AM   Specimen: BLOOD  Result Value Ref Range Status   Specimen Description   Final    BLOOD LEFT ANTECUBITAL Performed at [facility] [address] [place], [place] [address]    Special Requests   Final    BOTTLES DRAWN AEROBIC AND ANAEROBIC Blood Culture adequate volume Performed at [facility] [address], [place], [place] [address]    Culture   Final    NO GROWTH 2 DAYS Performed at [place] [address], [place], [place] [address]    Report Status PENDING  Incomplete  Culture, blood (routine x 2)     Status: None (Preliminary result)   Collection Time: 12/26/18  8:44 AM   Specimen: BLOOD  Result Value Ref Range Status   Specimen Description   Final    BLOOD RIGHT ARM Performed at [facility] [address] [place], [place] [address]    Special Requests   Final    BOTTLES DRAWN AEROBIC AND  ANAEROBIC Blood Culture results may not be optimal due to an inadequate volume of blood received in culture bottles Performed at [facility] [address] [place], [place] [address]    Culture   Final    NO GROWTH 2 DAYS Performed at [facility] [address], [place], [place] [address]    Report Status PENDING  Incomplete  Surgical pcr screen     Status: Abnormal   Collection Time: 12/28/18 12:59 PM   Specimen: Nasal Mucosa; Nasal Swab  Result Value Ref Range Status   MRSA, PCR NEGATIVE NEGATIVE Final   Staphylococcus aureus POSITIVE (A) NEGATIVE Final    Comment: (NOTE) The Xpert SA Assay (FDA approved for NASAL specimens in patients 32 years of age and older), is one component of a comprehensive surveillance program. It is not intended to diagnose infection nor to guide or monitor treatment. Performed at [facility], [place] [address] [place], [place] [address]   SARS CORONAVIRUS 2 (TAT 6-24 HRS) Nasopharyngeal Nasopharyngeal Swab     Status: None   Collection Time: 12/28/18  5:39 PM   Specimen: Nasopharyngeal Swab  Result Value Ref Range Status   SARS Coronavirus 2 NEGATIVE NEGATIVE Final    Comment: (NOTE) SARS-CoV-2 target nucleic acids are NOT DETECTED. The SARS-CoV-2 RNA is generally detectable in upper and lower respiratory specimens during the acute phase of infection. Negative results do not preclude SARS-CoV-2 infection, do not rule out co-infections with other pathogens, and should not be used as the sole basis for treatment or other patient management decisions. Negative results must be combined with clinical observations, patient history, and epidemiological information. The expected result is Negative. Fact Sheet for Patients: SugarRoll.be Fact Sheet for Healthcare Providers: https://www.woods-mathews.com/ This test is not yet approved or cleared by the [place] FDA  and  has been authorized for detection and/or diagnosis of SARS-CoV-2 by FDA under an Emergency Use Authorization (EUA). This EUA will remain  in effect (meaning this test can be used) for the duration of the COVID-19 declaration under Section 56 4(b)(1) of the Act, 21 U.S.C. section 360bbb-3(b)(1), unless the authorization is terminated or revoked sooner. Performed at [facility], [place] [address]  [place], [place] [address]      Labs: BNP (last 3 results) Recent Labs    12/25/18 2304  BNP [id number]   Basic Metabolic Panel: Recent Labs  Lab 12/25/18 2304 12/26/18 0457 12/27/18 0452 12/28/18 0536 12/29/18 0426  NA 134* 135 136 134* 134*  K 3.3* 3.9 3.9 3.6 4.2  CL 96* 101 104 102 100  CO2 25 23 21* 20* 22  GLUCOSE 164* 177* 123* 129* 149*  BUN 57* 56* 38* 28* 22  CREATININE 2.23* 2.29* 1.78* 1.32* 1.27*  CALCIUM 8.0* 8.0* 8.0* 7.8* 8.2*  MG 2.3 2.3  --   --   --   PHOS 2.5  --   --   --   --    Liver Function Tests: Recent Labs  Lab 12/25/18 1513  AST 25  ALT 31  ALKPHOS 123  BILITOT 2.6*  PROT 7.4  ALBUMIN 2.7*   No results for input(s): LIPASE, AMYLASE in the last 168 hours. No results for input(s): AMMONIA in the last 168 hours. CBC: Recent Labs  Lab 12/25/18 1513 12/25/18 2304 12/26/18 0457 12/27/18 0452 12/28/18 0942 12/29/18 0426  WBC 15.8* 13.9* 13.4* 9.3 12.0* 13.4*  NEUTROABS 12.9*  --   --  6.9  --  11.0*  HGB 10.7* 9.5* 9.3* 8.6* 8.6* 8.9*  HCT 33.3* 29.5* 29.5* 27.9* 27.4* 29.2*  MCV 94.3 95.2 94.9 97.2 95.8 95.7  PLT 492* 443* 416* 310 301 286   Cardiac Enzymes: Recent Labs  Lab 12/25/18 1513  CKTOTAL 33*   BNP: Invalid input(s): POCBNP CBG: Recent Labs  Lab 12/28/18 1147 12/28/18 1355 12/28/18 1704 12/28/18 2120 12/29/18 0758  GLUCAP 100* 104* 131* 205* 131*   D-Dimer No results for input(s): DDIMER in the last 72 hours. Hgb A1c No results for input(s): HGBA1C in the last 72 hours. Lipid Profile No results for  input(s): CHOL, HDL, LDLCALC, TRIG, CHOLHDL, LDLDIRECT in the last 72 hours. Thyroid function studies No results for input(s): TSH, T4TOTAL, T3FREE, THYROIDAB in the last 72 hours.  Invalid input(s): FREET3 Anemia work up No results for input(s): VITAMINB12, FOLATE, FERRITIN, TIBC, IRON, RETICCTPCT in the last 72 hours. Urinalysis    Component Value Date/Time   COLORURINE BROWN (A) 12/25/2018 1513   APPEARANCEUR TURBID (A) 12/25/2018 1513   LABSPEC 1.017 12/25/2018 1513   PHURINE 8.0 12/25/2018 1513   GLUCOSEU NEGATIVE 12/25/2018 1513   HGBUR SMALL (A) 12/25/2018 1513   BILIRUBINUR NEGATIVE 12/25/2018 1513   KETONESUR 5 (A) 12/25/2018 1513   PROTEINUR >=300 (A) 12/25/2018 1513   NITRITE NEGATIVE 12/25/2018 1513   LEUKOCYTESUR MODERATE (A) 12/25/2018 1513   Sepsis Labs Invalid input(s): PROCALCITONIN,  WBC,  LACTICIDVEN Microbiology Recent Results (from the past 240 hour(s))  Urine culture     Status: Abnormal   Collection Time: 12/25/18  6:43 PM   Specimen: Urine, Clean Catch  Result Value Ref Range Status   Specimen Description   Final    URINE, CLEAN CATCH Performed at [facility], [place] [address], [place], [place] [address]    Special Requests   Final    NONE Performed at [facility], [place] [address] [place], [place] [address]    Culture >=100,000 COLONIES/mL PROTEUS VULGARIS (A)  Final   Report Status 12/28/2018 FINAL  Final   Organism ID, Bacteria PROTEUS VULGARIS (A)  Final      Susceptibility   Proteus vulgaris - MIC*    AMPICILLIN >=32 RESISTANT Resistant  CEFAZOLIN >=64 RESISTANT Resistant     CEFTRIAXONE <=1 SENSITIVE Sensitive     CIPROFLOXACIN <=0.25 SENSITIVE Sensitive     GENTAMICIN <=1 SENSITIVE Sensitive     IMIPENEM 4 SENSITIVE Sensitive     NITROFURANTOIN 128 RESISTANT Resistant     TRIMETH/SULFA <=20 SENSITIVE Sensitive     AMPICILLIN/SULBACTAM 16 INTERMEDIATE Intermediate     PIP/TAZO <=4 SENSITIVE  Sensitive     * >=100,000 COLONIES/mL PROTEUS VULGARIS  SARS CORONAVIRUS 2 (TAT 6-24 HRS) Nasopharyngeal Nasopharyngeal Swab     Status: None   Collection Time: 12/25/18  7:59 PM   Specimen: Nasopharyngeal Swab  Result Value Ref Range Status   SARS Coronavirus 2 NEGATIVE NEGATIVE Final    Comment: (NOTE) SARS-CoV-2 target nucleic acids are NOT DETECTED. The SARS-CoV-2 RNA is generally detectable in upper and lower respiratory specimens during the acute phase of infection. Negative results do not preclude SARS-CoV-2 infection, do not rule out co-infections with other pathogens, and should not be used as the sole basis for treatment or other patient management decisions. Negative results must be combined with clinical observations, patient history, and epidemiological information. The expected result is Negative. Fact Sheet for Patients: SugarRoll.be Fact Sheet for Healthcare Providers: https://www.woods-mathews.com/ This test is not yet approved or cleared by the [place] FDA and  has been authorized for detection and/or diagnosis of SARS-CoV-2 by FDA under an Emergency Use Authorization (EUA). This EUA will remain  in effect (meaning this test can be used) for the duration of the COVID-19 declaration under Section 56 4(b)(1) of the Act, 21 U.S.C. section 360bbb-3(b)(1), unless the authorization is terminated or revoked sooner. Performed at [facility], [place] [address] [place], [place] [address]   Culture, blood (routine x 2)     Status: None (Preliminary result)   Collection Time: 12/26/18  8:37 AM   Specimen: BLOOD  Result Value Ref Range Status   Specimen Description   Final    BLOOD LEFT ANTECUBITAL Performed at [place] [address], [place], [place] [address]    Special Requests   Final    BOTTLES DRAWN AEROBIC AND ANAEROBIC Blood Culture adequate volume Performed at [place] [address], [place], [place] [address]    Culture   Final    NO GROWTH 2 DAYS Performed at [facility] [address] [place], [place] [address]    Report Status PENDING  Incomplete  Culture, blood (routine x 2)     Status: None (Preliminary result)   Collection Time: 12/26/18  8:44 AM   Specimen: BLOOD  Result Value Ref Range Status   Specimen Description   Final    BLOOD RIGHT ARM Performed at [facility] [address] [place], [place] [address]    Special Requests   Final    BOTTLES DRAWN AEROBIC AND ANAEROBIC Blood Culture results may not be optimal due to an inadequate volume of blood received in culture bottles Performed at [facility] [address], [place], [place] [address]    Culture   Final    NO GROWTH 2 DAYS Performed at [facility] [address] [place], [place] [address]    Report Status PENDING  Incomplete  Surgical pcr screen     Status: Abnormal   Collection Time: 12/28/18 12:59 PM   Specimen: Nasal Mucosa; Nasal Swab  Result Value Ref Range Status   MRSA, PCR NEGATIVE NEGATIVE Final  Staphylococcus aureus POSITIVE (A) NEGATIVE Final    Comment: (NOTE) The Xpert SA Assay (FDA approved for NASAL specimens in patients 53 years of age and older), is one component of a comprehensive surveillance program. It is not intended to diagnose infection nor to guide or monitor treatment. Performed at [facility], [place] [address], [place], [place] [address]   SARS CORONAVIRUS 2 (TAT 6-24 HRS) Nasopharyngeal Nasopharyngeal Swab     Status: None   Collection Time: 12/28/18  5:39 PM   Specimen: Nasopharyngeal Swab  Result Value Ref Range Status   SARS Coronavirus 2 NEGATIVE NEGATIVE Final    Comment: (NOTE) SARS-CoV-2 target nucleic acids are NOT DETECTED. The SARS-CoV-2 RNA is generally detectable in upper and lower respiratory specimens during the acute phase of  infection. Negative results do not preclude SARS-CoV-2 infection, do not rule out co-infections with other pathogens, and should not be used as the sole basis for treatment or other patient management decisions. Negative results must be combined with clinical observations, patient history, and epidemiological information. The expected result is Negative. Fact Sheet for Patients: SugarRoll.be Fact Sheet for Healthcare Providers: https://www.woods-mathews.com/ This test is not yet approved or cleared by the [place] FDA and  has been authorized for detection and/or diagnosis of SARS-CoV-2 by FDA under an Emergency Use Authorization (EUA). This EUA will remain  in effect (meaning this test can be used) for the duration of the COVID-19 declaration under Section 56 4(b)(1) of the Act, 21 U.S.C. section 360bbb-3(b)(1), unless the authorization is terminated or revoked sooner. Performed at [facility], [place] [address], [place], [place] [address]     Please note: You were cared for by a hospitalist during your hospital stay. Once you are discharged, your primary care physician will handle any further medical issues. Please note that NO REFILLS for any discharge medications will be authorized once you are discharged, as it is imperative that you return to your primary care physician (or establish a relationship with a primary care physician if you do not have one) for your post hospital discharge needs so that they can reassess your need for medications and monitor your lab values.    Time coordinating discharge: 40 minutes  SIGNED:   Shelly Coss, MD  [facility] 12/29/2018, 10:54 AM Pager [phone]  If 7PM-7AM, please contact night-coverage [email or web address] Password [email or web address]

## 2018-12-29 NOTE — Progress Notes (Signed)
1 Day Post-Op  Assessment:  73 yo man with T1 bladder cancer POD1 s/p restaging TURBT.  Foley catheter draining clear yellow urine and creatinine improving.    Plan:  -keep foley catheter for 7 days -pt ok for d/c from urology standpoint -follow up at [facility] for void trial and pathology results  Subjective: Patient is without complaint.  Objective: Vital signs in last 24 hours: Temp:  [97.4 F (36.3 C)-98.7 F (37.1 C)] 98.3 F (36.8 C) (12/05 0527) Pulse Rate:  [80-96] 83 (12/05 0527) Resp:  [11-23] 18 (12/05 0527) BP: (120-145)/(63-85) 124/73 (12/05 0527) SpO2:  [95 %-99 %] 98 % (12/05 0527) Weight:  [94.3 kg-101.8 kg] 101.8 kg (12/05 0527)  Intake/Output from previous day: 12/04 0701 - 12/05 0700 In: 1502.8 [P.O.:820; I.V.:582.8; IV Piggyback:100] Out: 1680 [Urine:1630; Blood:50] Intake/Output this shift: No intake/output data recorded.  Past Medical History:  Diagnosis Date  . Hypertension   . Pneumonia    as a child  . Pre-diabetes    Current Facility-Administered Medications  Medication Dose Route Frequency Provider Last Rate Last Dose  . 0.9 %  sodium chloride infusion   Intravenous PRN Shelly Coss, MD 10 mL/hr at 12/28/18 2200    . cefTRIAXone (ROCEPHIN) 1 g in sodium chloride 0.9 % 100 mL IVPB  1 g Intravenous Q24H Bonnell Public, MD   Stopped at 12/28/18 2131  . Chlorhexidine Gluconate Cloth 2 % PADS 6 each  6 each Topical Daily Shelly Coss, MD   6 each at 12/27/18 [phone]  . feeding supplement (BOOST / RESOURCE BREEZE) liquid 1 Container  1 Container Oral Q24H Shelly Coss, MD   1 Container at 12/27/18 [phone]  . feeding supplement (ENSURE ENLIVE) (ENSURE ENLIVE) liquid 237 mL  237 mL Oral BID BM Shelly Coss, MD   237 mL at 12/28/18 2034  . feeding supplement (PRO-STAT SUGAR FREE 64) liquid 30 mL  30 mL Oral Daily Shelly Coss, MD   30 mL at 12/27/18 1727  . fluticasone (FLONASE) 50 MCG/ACT nasal spray 2 spray  2 spray Each Nare  Daily PRN Dana Allan I, MD      . heparin injection 5,000 Units  5,000 Units Subcutaneous Q8H Dana Allan I, MD   5,000 Units at 12/29/18 0527  . insulin aspart (novoLOG) injection 0-5 Units  0-5 Units Subcutaneous QHS Dana Allan I, MD   2 Units at 12/28/18 2150  . insulin aspart (novoLOG) injection 0-9 Units  0-9 Units Subcutaneous TID WC Dana Allan I, MD   1 Units at 12/29/18 0919  . lidocaine (XYLOCAINE) 2 % jelly 1 application  1 application Urethral Once Franchot Gallo, MD      . multivitamin with minerals tablet 1 tablet  1 tablet Oral Daily Shelly Coss, MD   1 tablet at 12/27/18 [id number]  . nystatin cream (MYCOSTATIN)   Topical BID Shelly Coss, MD      . oxybutynin (DITROPAN) tablet 5 mg  5 mg Oral Q8H PRN Dana Allan I, MD   5 mg at 12/26/18 1943  . polyvinyl alcohol (LIQUIFILM TEARS) 1.4 % ophthalmic solution 1 drop  1 drop Both Eyes TID PRN Bonnell Public, MD        Physical Exam:  General: Patient is in no apparent distress Lungs: Normal respiratory effort, chest expands symmetrically. GI: The abdomen is soft and nontender without mass. GU: foley draining clear yellow urine    Lab Results: Recent Labs    12/27/18 0452 12/28/18 0942 12/29/18  0426  WBC 9.3 12.0* 13.4*  HGB 8.6* 8.6* 8.9*  HCT 27.9* 27.4* 29.2*   BMET Recent Labs    12/28/18 0536 12/29/18 0426  NA 134* 134*  K 3.6 4.2  CL 102 100  CO2 20* 22  GLUCOSE 129* 149*  BUN 28* 22  CREATININE 1.32* 1.27*  CALCIUM 7.8* 8.2*   No results for input(s): LABPT, INR in the last 72 hours. No results for input(s): LABURIN in the last 72 hours. Results for orders placed or performed during the hospital encounter of 12/25/18  Urine culture     Status: Abnormal   Collection Time: 12/25/18  6:43 PM   Specimen: Urine, Clean Catch  Result Value Ref Range Status   Specimen Description   Final    URINE, CLEAN CATCH Performed at [facility], [place]  [address] [place], [place] [address]    Special Requests   Final    NONE Performed at [facility], [place] [address], [place], [place] [address]    Culture >=100,000 COLONIES/mL PROTEUS VULGARIS (A)  Final   Report Status 12/28/2018 FINAL  Final   Organism ID, Bacteria PROTEUS VULGARIS (A)  Final      Susceptibility   Proteus vulgaris - MIC*    AMPICILLIN >=32 RESISTANT Resistant     CEFAZOLIN >=64 RESISTANT Resistant     CEFTRIAXONE <=1 SENSITIVE Sensitive     CIPROFLOXACIN <=0.25 SENSITIVE Sensitive     GENTAMICIN <=1 SENSITIVE Sensitive     IMIPENEM 4 SENSITIVE Sensitive     NITROFURANTOIN 128 RESISTANT Resistant     TRIMETH/SULFA <=20 SENSITIVE Sensitive     AMPICILLIN/SULBACTAM 16 INTERMEDIATE Intermediate     PIP/TAZO <=4 SENSITIVE Sensitive     * >=100,000 COLONIES/mL PROTEUS VULGARIS  SARS CORONAVIRUS 2 (TAT 6-24 HRS) Nasopharyngeal Nasopharyngeal Swab     Status: None   Collection Time: 12/25/18  7:59 PM   Specimen: Nasopharyngeal Swab  Result Value Ref Range Status   SARS Coronavirus 2 NEGATIVE NEGATIVE Final    Comment: (NOTE) SARS-CoV-2 target nucleic acids are NOT DETECTED. The SARS-CoV-2 RNA is generally detectable in upper and lower respiratory specimens during the acute phase of infection. Negative results do not preclude SARS-CoV-2 infection, do not rule out co-infections with other pathogens, and should not be used as the sole basis for treatment or other patient management decisions. Negative results must be combined with clinical observations, patient history, and epidemiological information. The expected result is Negative. Fact Sheet for Patients: SugarRoll.be Fact Sheet for Healthcare Providers: https://www.woods-mathews.com/ This test is not yet approved or cleared by the [place] FDA and  has been authorized for detection and/or diagnosis of SARS-CoV-2 by FDA under an Emergency Use  Authorization (EUA). This EUA will remain  in effect (meaning this test can be used) for the duration of the COVID-19 declaration under Section 56 4(b)(1) of the Act, 21 U.S.C. section 360bbb-3(b)(1), unless the authorization is terminated or revoked sooner. Performed at [facility], [place] [address], [place], [place] [address]   Culture, blood (routine x 2)     Status: None (Preliminary result)   Collection Time: 12/26/18  8:37 AM   Specimen: BLOOD  Result Value Ref Range Status   Specimen Description   Final    BLOOD LEFT ANTECUBITAL Performed at [facility] [address], [place],  [address]    Special Requests   Final    BOTTLES DRAWN AEROBIC AND ANAEROBIC Blood Culture  adequate volume Performed at [facility] [address], [place], [place] [address]    Culture   Final    NO GROWTH 2 DAYS Performed at [facility] [address] [place], [place] [address]    Report Status PENDING  Incomplete  Culture, blood (routine x 2)     Status: None (Preliminary result)   Collection Time: 12/26/18  8:44 AM   Specimen: BLOOD  Result Value Ref Range Status   Specimen Description   Final    BLOOD RIGHT ARM Performed at [facility] [address], [place], [place] [address]    Special Requests   Final    BOTTLES DRAWN AEROBIC AND ANAEROBIC Blood Culture results may not be optimal due to an inadequate volume of blood received in culture bottles Performed at [facility] [address] [place], [place] [address]    Culture   Final    NO GROWTH 2 DAYS Performed at [facility] [address] [place], [place] [address]    Report Status PENDING  Incomplete  Surgical pcr screen     Status: Abnormal   Collection Time: 12/28/18 12:59 PM   Specimen: Nasal Mucosa; Nasal Swab  Result Value Ref Range Status   MRSA, PCR NEGATIVE NEGATIVE Final   Staphylococcus aureus POSITIVE (A)  NEGATIVE Final    Comment: (NOTE) The Xpert SA Assay (FDA approved for NASAL specimens in patients 44 years of age and older), is one component of a comprehensive surveillance program. It is not intended to diagnose infection nor to guide or monitor treatment. Performed at [facility], [place] [address], [place], [place] [address]   SARS CORONAVIRUS 2 (TAT 6-24 HRS) Nasopharyngeal Nasopharyngeal Swab     Status: None   Collection Time: 12/28/18  5:39 PM   Specimen: Nasopharyngeal Swab  Result Value Ref Range Status   SARS Coronavirus 2 NEGATIVE NEGATIVE Final    Comment: (NOTE) SARS-CoV-2 target nucleic acids are NOT DETECTED. The SARS-CoV-2 RNA is generally detectable in upper and lower respiratory specimens during the acute phase of infection. Negative results do not preclude SARS-CoV-2 infection, do not rule out co-infections with other pathogens, and should not be used as the sole basis for treatment or other patient management decisions. Negative results must be combined with clinical observations, patient history, and epidemiological information. The expected result is Negative. Fact Sheet for Patients: SugarRoll.be Fact Sheet for Healthcare Providers: https://www.woods-mathews.com/ This test is not yet approved or cleared by the [place] FDA and  has been authorized for detection and/or diagnosis of SARS-CoV-2 by FDA under an Emergency Use Authorization (EUA). This EUA will remain  in effect (meaning this test can be used) for the duration of the COVID-19 declaration under Section 56 4(b)(1) of the Act, 21 U.S.C. section 360bbb-3(b)(1), unless the authorization is terminated or revoked sooner. Performed at [facility], [place] [address] [place], [place] [address]     Studies/Results: No results found.     Laniyah Rosenwald D Shiann Kam 12/29/2018, 9:49 AM

## 2018-12-29 NOTE — Progress Notes (Signed)
Attempted to call report to [facility] x 2  nurse unavailable. Will try again prior to [facility] pick up.

## 2018-12-29 NOTE — TOC Transition Note (Signed)
Transition of Care [facility]) - CM/SW Discharge Note   Patient Details  Name: [name]. MRN: [id number] Date of Birth: 12/12/1945  [facility] CM/SW Contact:  Wende Neighbors, LCSW Phone Number: 12/29/2018, 12:28 PM   Clinical Narrative:   Patient to discharge to [facility] via [place] [facility]. RN to please call [phone] for report. Facility requested patient be picked up by [facility] at 2 pm to give there staff time to get patients room ready for arrival.     Final next level of care: Skilled Nursing Facility Barriers to Discharge: No Barriers Identified   Patient Goals and CMS Choice        Discharge Placement PASRR number recieved: 12/27/18            Patient chooses bed at: [facility] Patient to be transferred to facility by: [facility] Name of family member notified: RN to make family aware Patient and family notified of of transfer: 12/29/18  Discharge Plan and Services   Discharge Planning Services: CM Consult                                 Social Determinants of Health ([employer]) Interventions     Readmission Risk Interventions No flowsheet data found.

## 2018-12-29 NOTE — Progress Notes (Signed)
Report given to Adalberto Ill RN at [facility]. Still awaiting [facility] transport

## 2018-12-29 NOTE — Plan of Care (Signed)
  Problem: Nutrition Goal: Nutritional status is improving Description: Monitor and assess patient for malnutrition (ex- brittle hair, bruises, dry skin, pale skin and conjunctiva, muscle wasting, smooth red tongue, and disorientation). Collaborate with interdisciplinary team and initiate plan and interventions as ordered.  Monitor patient's weight and dietary intake as ordered or per policy. Utilize nutrition screening tool and intervene per policy. Determine patient's food preferences and provide high-protein, high-caloric foods as appropriate.  Outcome: Progressing   Problem: Education: Goal: Knowledge of General Education information will improve Description: Including pain rating scale, medication(s)/side effects and non-pharmacologic comfort measures Outcome: Progressing   Problem: Clinical Measurements: Goal: Respiratory complications will improve Outcome: Progressing Goal: Cardiovascular complication will be avoided Outcome: Progressing   Problem: Nutrition: Goal: Adequate nutrition will be maintained Outcome: Progressing   Problem: Elimination: Goal: Will not experience complications related to urinary retention Outcome: Progressing   Problem: Pain Managment: Goal: General experience of comfort will improve Outcome: Progressing   Problem: Safety: Goal: Ability to remain free from injury will improve Outcome: Progressing

## 2018-12-30 NOTE — Anesthesia Postprocedure Evaluation (Signed)
Anesthesia Post Note  Patient: [name].  Procedure(s) Performed: TRANSURETHRAL RESECTION OF BLADDER TUMOR (TURBT) (N/A )     Patient location during evaluation: PACU Anesthesia Type: General Level of consciousness: awake and alert Pain management: pain level controlled Vital Signs Assessment: post-procedure vital signs reviewed and stable Respiratory status: spontaneous breathing, nonlabored ventilation, respiratory function stable and patient connected to nasal cannula oxygen Cardiovascular status: blood pressure returned to baseline and stable Postop Assessment: no apparent nausea or vomiting Anesthetic complications: no    Last Vitals:  Vitals:   12/29/18 0527 12/29/18 1551  BP: 124/73 122/65  Pulse: 83 84  Resp: 18 20  Temp: 36.8 C 36.7 C  SpO2: 98% 99%    Last Pain:  Vitals:   12/29/18 1551  TempSrc: Oral  PainSc:                  Catalina Gravel

## 2018-12-31 ENCOUNTER — Encounter (HOSPITAL_COMMUNITY): Payer: Self-pay | Admitting: Urology

## 2018-12-31 LAB — CULTURE, BLOOD (ROUTINE X 2)
Culture: NO GROWTH
Culture: NO GROWTH
Special Requests: ADEQUATE

## 2018-12-31 LAB — SURGICAL PATHOLOGY

## 2019-01-10 ENCOUNTER — Other Ambulatory Visit: Payer: Self-pay | Admitting: Internal Medicine

## 2019-01-10 ENCOUNTER — Encounter (HOSPITAL_COMMUNITY): Payer: Self-pay | Admitting: Emergency Medicine

## 2019-01-10 ENCOUNTER — Other Ambulatory Visit (HOSPITAL_COMMUNITY): Payer: Self-pay | Admitting: Internal Medicine

## 2019-01-10 ENCOUNTER — Inpatient Hospital Stay (HOSPITAL_COMMUNITY)
Admission: EM | Admit: 2019-01-10 | Discharge: 2019-01-12 | DRG: 683 | Disposition: A | Discharge status: Skilled Nursing Facility | Payer: Medicare Other | Source: Skilled Nursing Facility | Attending: Internal Medicine | Admitting: Internal Medicine

## 2019-01-10 ENCOUNTER — Emergency Department (HOSPITAL_COMMUNITY): Payer: Medicare Other

## 2019-01-10 ENCOUNTER — Other Ambulatory Visit: Payer: Self-pay

## 2019-01-10 DIAGNOSIS — N1832 Chronic kidney disease, stage 3b: Secondary | ICD-10-CM | POA: Diagnosis present

## 2019-01-10 DIAGNOSIS — N183 Chronic kidney disease, stage 3 unspecified: Secondary | ICD-10-CM

## 2019-01-10 DIAGNOSIS — N179 Acute kidney failure, unspecified: Principal | ICD-10-CM | POA: Diagnosis present

## 2019-01-10 DIAGNOSIS — C679 Malignant neoplasm of bladder, unspecified: Secondary | ICD-10-CM | POA: Diagnosis present

## 2019-01-10 DIAGNOSIS — Z87891 Personal history of nicotine dependence: Secondary | ICD-10-CM

## 2019-01-10 DIAGNOSIS — B964 Proteus (mirabilis) (morganii) as the cause of diseases classified elsewhere: Secondary | ICD-10-CM | POA: Diagnosis present

## 2019-01-10 DIAGNOSIS — T83511A Infection and inflammatory reaction due to indwelling urethral catheter, initial encounter: Secondary | ICD-10-CM | POA: Diagnosis present

## 2019-01-10 DIAGNOSIS — Z66 Do not resuscitate: Secondary | ICD-10-CM | POA: Diagnosis present

## 2019-01-10 DIAGNOSIS — I129 Hypertensive chronic kidney disease with stage 1 through stage 4 chronic kidney disease, or unspecified chronic kidney disease: Secondary | ICD-10-CM | POA: Diagnosis present

## 2019-01-10 DIAGNOSIS — E872 Acidosis: Secondary | ICD-10-CM | POA: Diagnosis present

## 2019-01-10 DIAGNOSIS — Y846 Urinary catheterization as the cause of abnormal reaction of the patient, or of later complication, without mention of misadventure at the time of the procedure: Secondary | ICD-10-CM | POA: Diagnosis present

## 2019-01-10 DIAGNOSIS — R339 Retention of urine, unspecified: Secondary | ICD-10-CM

## 2019-01-10 DIAGNOSIS — T502X5A Adverse effect of carbonic-anhydrase inhibitors, benzothiadiazides and other diuretics, initial encounter: Secondary | ICD-10-CM | POA: Diagnosis present

## 2019-01-10 DIAGNOSIS — N390 Urinary tract infection, site not specified: Secondary | ICD-10-CM | POA: Diagnosis present

## 2019-01-10 DIAGNOSIS — L89151 Pressure ulcer of sacral region, stage 1: Secondary | ICD-10-CM | POA: Diagnosis present

## 2019-01-10 DIAGNOSIS — Z20828 Contact with and (suspected) exposure to other viral communicable diseases: Secondary | ICD-10-CM | POA: Diagnosis present

## 2019-01-10 DIAGNOSIS — D494 Neoplasm of unspecified behavior of bladder: Secondary | ICD-10-CM | POA: Diagnosis present

## 2019-01-10 DIAGNOSIS — E1122 Type 2 diabetes mellitus with diabetic chronic kidney disease: Secondary | ICD-10-CM | POA: Diagnosis present

## 2019-01-10 DIAGNOSIS — E119 Type 2 diabetes mellitus without complications: Secondary | ICD-10-CM

## 2019-01-10 LAB — BASIC METABOLIC PANEL
Anion gap: 16 — ABNORMAL HIGH (ref 5–15)
BUN: 84 mg/dL — ABNORMAL HIGH (ref 8–23)
CO2: 17 mmol/L — ABNORMAL LOW (ref 22–32)
Calcium: 8.3 mg/dL — ABNORMAL LOW (ref 8.9–10.3)
Chloride: 101 mmol/L (ref 98–111)
Creatinine, Ser: 3.97 mg/dL — ABNORMAL HIGH (ref 0.61–1.24)
GFR calc Af Amer: 16 mL/min — ABNORMAL LOW (ref >60)
GFR calc non Af Amer: 14 mL/min — ABNORMAL LOW (ref >60)
Glucose, Bld: 129 mg/dL — ABNORMAL HIGH (ref 70–99)
Potassium: 4.4 mmol/L (ref 3.5–5.1)
Sodium: 134 mmol/L — ABNORMAL LOW (ref 135–145)

## 2019-01-10 LAB — POCT I-STAT EG7
Acid-base deficit: 1 mmol/L (ref 0.0–2.0)
Bicarbonate: 23.7 mmol/L (ref 20.0–28.0)
Calcium, Ion: 1.15 mmol/L (ref 1.15–1.40)
HCT: 33 % — ABNORMAL LOW (ref 39.0–52.0)
Hemoglobin: 11.2 g/dL — ABNORMAL LOW (ref 13.0–17.0)
O2 Saturation: 62 %
Potassium: 4.3 mmol/L (ref 3.5–5.1)
Sodium: 135 mmol/L (ref 135–145)
TCO2: 25 mmol/L (ref 22–32)
pCO2, Ven: 36.4 mmHg — ABNORMAL LOW (ref 44.0–60.0)
pH, Ven: 7.421 (ref 7.250–7.430)
pO2, Ven: 31 mmHg — CL (ref 32.0–45.0)

## 2019-01-10 LAB — URINALYSIS, ROUTINE W REFLEX MICROSCOPIC
Bilirubin Urine: NEGATIVE
Glucose, UA: NEGATIVE mg/dL
Ketones, ur: 5 mg/dL — AB
Nitrite: NEGATIVE
Protein, ur: 100 mg/dL — AB
Specific Gravity, Urine: 1.012 (ref 1.005–1.030)
WBC, UA: >50 WBC/hpf — ABNORMAL HIGH (ref 0–5)
pH: 6 (ref 5.0–8.0)

## 2019-01-10 LAB — CBC
HCT: 34.2 % — ABNORMAL LOW (ref 39.0–52.0)
Hemoglobin: 10.7 g/dL — ABNORMAL LOW (ref 13.0–17.0)
MCH: 28.6 pg (ref 26.0–34.0)
MCHC: 31.3 g/dL (ref 30.0–36.0)
MCV: 91.4 fL (ref 80.0–100.0)
Platelets: 335 10*3/uL (ref 150–400)
RBC: 3.74 MIL/uL — ABNORMAL LOW (ref 4.22–5.81)
RDW: 17.9 % — ABNORMAL HIGH (ref 11.5–15.5)
WBC: 15.3 10*3/uL — ABNORMAL HIGH (ref 4.0–10.5)
nRBC: 0 % (ref 0.0–0.2)

## 2019-01-10 LAB — RESPIRATORY PANEL BY RT PCR (FLU A&B, COVID)
Influenza A by PCR: NEGATIVE
Influenza B by PCR: NEGATIVE
SARS Coronavirus 2 by RT PCR: NEGATIVE

## 2019-01-10 LAB — POC SARS CORONAVIRUS 2 AG -  ED: SARS Coronavirus 2 Ag: NEGATIVE

## 2019-01-10 MED ORDER — INSULIN ASPART 100 UNIT/ML ~~LOC~~ SOLN
0.0000 [IU] | Freq: Three times a day (TID) | SUBCUTANEOUS | Status: DC
  Administered 2019-01-11 (×2): 1 [IU] via SUBCUTANEOUS
  Administered 2019-01-11 – 2019-01-12 (×2): 2 [IU] via SUBCUTANEOUS

## 2019-01-10 MED ORDER — HEPARIN SODIUM (PORCINE) 5000 UNIT/ML IJ SOLN
5000.0000 [IU] | Freq: Three times a day (TID) | INTRAMUSCULAR | Status: DC
  Administered 2019-01-11 – 2019-01-12 (×4): 5000 [IU] via SUBCUTANEOUS
  Filled 2019-01-10 (×4): qty 1

## 2019-01-10 MED ORDER — SODIUM CHLORIDE 0.9 % IV SOLN
1.0000 g | Freq: Once | INTRAVENOUS | Status: AC
  Administered 2019-01-10: 1 g via INTRAVENOUS
  Filled 2019-01-10: qty 10

## 2019-01-10 MED ORDER — LIDOCAINE HCL URETHRAL/MUCOSAL 2 % EX GEL
1.0000 "application " | Freq: Once | CUTANEOUS | Status: AC
  Administered 2019-01-10: 1 via TOPICAL
  Filled 2019-01-10: qty 20

## 2019-01-10 MED ORDER — ACETAMINOPHEN 650 MG RE SUPP: 650.0000 mg | Freq: Four times a day (QID) | RECTAL | Status: DC | PRN

## 2019-01-10 MED ORDER — INSULIN ASPART 100 UNIT/ML ~~LOC~~ SOLN: 0.0000 [IU] | Freq: Every day | SUBCUTANEOUS | Status: DC

## 2019-01-10 MED ORDER — ACETAMINOPHEN 325 MG PO TABS: 650.0000 mg | ORAL_TABLET | Freq: Four times a day (QID) | ORAL | Status: DC | PRN

## 2019-01-10 MED ORDER — SODIUM CHLORIDE 0.9 % IV SOLN
1.0000 g | INTRAVENOUS | Status: DC
  Administered 2019-01-11: 1 g via INTRAVENOUS
  Filled 2019-01-10: qty 10
  Filled 2019-01-10: qty 1

## 2019-01-10 NOTE — ED Provider Notes (Signed)
[facility] EMERGENCY DEPARTMENT Provider Note   CSN: [id number] Arrival date & time: 01/10/19  1726     History No chief complaint on file.    HPI [name]. is a 73 y.o. male with a medical history of bladder cancer, hypertension who presents to the ED for increasing creatinine.  He presents from rehab nursing facility, was having lab work and noted to have a creatinine of 5, increased from baseline.  He does have recent bladder resection for tumor on 12/4.  Also with history of CKD stage III.  He reports that he has been feeling normal recently, no fever, vomiting, abdominal pain, chest pain. He states normal PO intake. Denies sensation of retaining urine. Bladder scan this evening 320 mL.       Past Medical History:  Diagnosis Date  . Hypertension   . Pneumonia    as a child  . Pre-diabetes     Patient Active Problem List   Diagnosis Date Noted  . CKD (chronic kidney disease) stage 3, GFR 30-59 ml/min 01/11/2019  . Complicated UTI (urinary tract infection) 01/11/2019  . Type 2 diabetes mellitus ([place]) 01/11/2019  . AKI (acute kidney injury) ([place]) 01/10/2019  . FTT (failure to thrive) in adult 12/25/2018  . 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 CHOLECYSTECTOMY  2003  . FRACTURE SURGERY  1996   skull fx  . TRANSURETHRAL RESECTION OF BLADDER TUMOR N/A 12/07/2018   Procedure: TRANSURETHRAL RESECTION OF BLADDER TUMOR (TURBT);  Surgeon: Lucas Mallow, MD;  Location: [facility];  Service: Urology;  Laterality: N/A;  . TRANSURETHRAL RESECTION OF BLADDER TUMOR N/A 12/28/2018   Procedure: TRANSURETHRAL RESECTION OF BLADDER TUMOR (TURBT);  Surgeon: Lucas Mallow, MD;  Location: [facility];  Service: Urology;  Laterality: N/A;       History reviewed. No pertinent family history.  Social History   Tobacco Use  . Smoking status: Former Smoker    Packs/Rebbie Lauricella: 1.00    Years: 15.00    Pack years: 15.00    Types: Cigarettes    Quit date: 12/05/1977    Years since quitting: 41.1  . Smokeless tobacco: Never Used  Substance Use Topics  . Alcohol use: Yes  . Drug use: Not Currently    Home Medications Prior to Admission medications   Medication Sig Start Date End Date Taking? Authorizing Provider  citalopram (CELEXA) 20 MG tablet Take 20 mg by mouth daily.  04/29/15   [provider]  fluticasone (FLONASE) 50 MCG/ACT nasal spray Place 2 sprays into both nostrils daily as needed for allergies. 11/24/17   [provider]  hydroxypropyl methylcellulose / hypromellose (ISOPTO TEARS / GONIOVISC) 2.5 % ophthalmic solution Place 1 drop into both eyes 3 (three) times daily as needed for dry eyes.    [provider]  lisinopril-hydrochlorothiazide (PRINZIDE,ZESTORETIC) 10-12.5 MG tablet Take 1 tablet by mouth daily.  04/29/15   [provider]  metFORMIN (GLUCOPHAGE-XR) 500 MG 24 hr tablet Take 500 mg by mouth daily with breakfast.    [provider]  oxybutynin (DITROPAN) 5 MG tablet Take 1 tablet (5 mg total) by mouth every 8 (eight) hours as needed for bladder spasms. 12/08/18   Franchot Gallo, MD    Allergies    Patient has no known allergies.  Review of Systems   Review of Systems  Constitutional: Negative for chills and fever.  HENT: Negative for congestion, ear pain  and sore throat.   Eyes: Negative for pain, discharge, redness and visual disturbance.  Respiratory: Negative for cough and shortness of breath.   Cardiovascular: Negative for chest pain and palpitations.  Gastrointestinal: Positive for abdominal pain. Negative for diarrhea, nausea and vomiting.  Genitourinary: Positive for difficulty urinating, dysuria and penile pain. Negative for frequency, hematuria, scrotal swelling and testicular pain.  Musculoskeletal: Negative for arthralgias, back pain and neck stiffness.  Skin: Negative for color change and rash.  Neurological: Negative for  seizures, syncope and weakness.  Psychiatric/Behavioral: Negative for agitation.  All other systems reviewed and are negative.   Physical Exam Updated Vital Signs BP 100/60 (BP Location: Right Arm)   Pulse 78   Temp 98.6 F (37 C) (Oral)   Resp 18   Ht 6\' 1"  (1.854 m)   Wt 89.7 kg   SpO2 100%   BMI 26.09 kg/m   Physical Exam Vitals and nursing note reviewed.  Constitutional:      General: He is in acute distress.     Appearance: Normal appearance. He is well-developed. He is not toxic-appearing.     Comments: Male who appears stated age, in mild acute distress, no respiratory distress  HENT:     Head: Normocephalic and atraumatic.     Right Ear: External ear normal.     Left Ear: External ear normal.     Nose: Nose normal. No congestion.     Mouth/Throat:     Mouth: Mucous membranes are moist.  Eyes:     General:        Right eye: No discharge.        Left eye: No discharge.     Conjunctiva/sclera: Conjunctivae normal.  Cardiovascular:     Rate and Rhythm: Normal rate and regular rhythm.     Pulses: Normal pulses.     Heart sounds: Normal heart sounds. No murmur.  Pulmonary:     Effort: Pulmonary effort is normal. No respiratory distress.     Breath sounds: Normal breath sounds. No wheezing or rales.  Abdominal:     General: Abdomen is flat. There is no distension.     Palpations: Abdomen is soft.     Tenderness: There is no abdominal tenderness.  Musculoskeletal:        General: No signs of injury. Normal range of motion.     Cervical back: Normal range of motion and neck supple.  Skin:    General: Skin is warm and dry.     Capillary Refill: Capillary refill takes less than 2 seconds.  Neurological:     General: No focal deficit present.     Mental Status: He is alert. Mental status is at baseline.  Psychiatric:        Mood and Affect: Mood normal.        Behavior: Behavior normal.     ED Results / Procedures / Treatments   Labs (all labs ordered are  listed, but only abnormal results are displayed) Labs Reviewed  URINALYSIS, ROUTINE W REFLEX MICROSCOPIC - Abnormal; Notable for the following components:      Result Value   APPearance TURBID (*)    Hgb urine dipstick LARGE (*)    Ketones, ur 5 (*)    Protein, ur 100 (*)    Leukocytes,Ua MODERATE (*)    WBC, UA >50 (*)    Bacteria, UA MANY (*)    All other components within normal limits  BASIC METABOLIC PANEL - Abnormal; Notable for the  following components:   Sodium 134 (*)    CO2 17 (*)    Glucose, Bld 129 (*)    BUN 84 (*)    Creatinine, Ser 3.97 (*)    Calcium 8.3 (*)    GFR calc non Af Amer 14 (*)    GFR calc Af Amer 16 (*)    Anion gap 16 (*)    All other components within normal limits  CBC - Abnormal; Notable for the following components:   WBC 15.3 (*)    RBC 3.74 (*)    Hemoglobin 10.7 (*)    HCT 34.2 (*)    RDW 17.9 (*)    All other components within normal limits  CBC - Abnormal; Notable for the following components:   RBC 3.41 (*)    Hemoglobin 9.7 (*)    HCT 31.3 (*)    RDW 18.0 (*)    All other components within normal limits  BASIC METABOLIC PANEL - Abnormal; Notable for the following components:   CO2 20 (*)    Glucose, Bld 137 (*)    BUN 75 (*)    Creatinine, Ser 3.03 (*)    Calcium 8.3 (*)    GFR calc non Af Amer 19 (*)    GFR calc Af Amer 22 (*)    All other components within normal limits  GLUCOSE, CAPILLARY - Abnormal; Notable for the following components:   Glucose-Capillary 128 (*)    All other components within normal limits  GLUCOSE, CAPILLARY - Abnormal; Notable for the following components:   Glucose-Capillary 127 (*)    All other components within normal limits  POCT I-STAT EG7 - Abnormal; Notable for the following components:   pCO2, Ven 36.4 (*)    pO2, Ven 31.0 (*)    HCT 33.0 (*)    Hemoglobin 11.2 (*)    All other components within normal limits  CBG MONITORING, ED - Abnormal; Notable for the following components:    Glucose-Capillary 126 (*)    All other components within normal limits  RESPIRATORY PANEL BY RT PCR (FLU A&B, COVID)  MRSA PCR SCREENING  URINE CULTURE  CULTURE, BLOOD (ROUTINE X 2)  CULTURE, BLOOD (ROUTINE X 2)  LACTIC ACID, PLASMA  I-STAT VENOUS BLOOD GAS, ED  POC SARS CORONAVIRUS 2 AG -  ED    EKG None  Radiology US RENAL  Result Date: 01/11/2019 CLINICAL DATA:  Acute kidney injury EXAM: RENAL / URINARY TRACT ULTRASOUND COMPLETE COMPARISON:  December 26, 2018 FINDINGS: Right Kidney: Renal measurements: 13 x 5.6 x 4.4 cm = volume: 166 mL. The kidneys are echogenic bilaterally. There is no hydronephrosis. Left Kidney: Renal measurements: 10.6 x 5 x 3.8 cm = volume: 104 mL. The kidney is echogenic. There is no hydronephrosis. Bladder: The bladder is not well evaluated secondary to the presence of a Foley catheter. Other: None. IMPRESSION: 1. No acute abnormality.  No hydronephrosis. 2. Echogenic kidneys bilaterally which can be seen in patients with medical renal disease. 3. Bladder poorly evaluated secondary to the presence of a Foley catheter. The previously demonstrated potential mass is not visualized on this exam. Electronically Signed   By: Constance Holster M.D.   On: 01/11/2019 00:52   DG Chest Portable 1 View  Result Date: 01/10/2019 CLINICAL DATA:  Hypoxia EXAM: PORTABLE CHEST 1 VIEW COMPARISON:  12/25/2018 FINDINGS: Heart and mediastinal contours are within normal limits. No focal opacities or effusions. No acute bony abnormality. IMPRESSION: No active disease. Electronically Signed  By: Rolm Baptise M.D.   On: 01/10/2019 20:01    Procedures Procedures (including critical care time)  Medications Ordered in ED Medications  cefTRIAXone (ROCEPHIN) 1 g in sodium chloride 0.9 % 100 mL IVPB (has no administration in time range)  heparin injection 5,000 Units (has no administration in time range)  acetaminophen (TYLENOL) tablet 650 mg (has no administration in time range)     Or  acetaminophen (TYLENOL) suppository 650 mg (has no administration in time range)  insulin aspart (novoLOG) injection 0-9 Units (1 Units Subcutaneous Given 01/11/19 1205)  insulin aspart (novoLOG) injection 0-5 Units (0 Units Subcutaneous Not Given 01/11/19 0004)  Chlorhexidine Gluconate Cloth 2 % PADS 6 each (6 each Topical Given 01/11/19 1030)  feeding supplement (ENSURE ENLIVE) (ENSURE ENLIVE) liquid 237 mL (has no administration in time range)  multivitamin with minerals tablet 1 tablet (has no administration in time range)  0.9 %  sodium chloride infusion (has no administration in time range)  lidocaine (XYLOCAINE) 2 % jelly 1 application (1 application Topical Given by Other 01/10/19 2126)  cefTRIAXone (ROCEPHIN) 1 g in sodium chloride 0.9 % 100 mL IVPB (0 g Intravenous Stopped 01/10/19 2358)  sodium chloride 0.9 % bolus 1,000 mL (0 mLs Intravenous Stopped 01/11/19 0434)    ED Course  I have reviewed the triage vital signs and the nursing notes.  Pertinent labs & imaging results that were available during my care of the patient were reviewed by me and considered in my medical decision making (see chart for details).  Procedure for coude catheter:  Sterile technique maintained. Prepped area with betadine and passed a coude, 20 [place] without resistance until return of cloudy urine. Inflated balloon with water 10 mL. Patient tolerated procedure well.    MDM Rules/Calculators/A&P                      Nephtali Hin. is a 73 y.o. male with a medical history of bladder cancer, hypertension, CKD stage III who presents to the ED from nursing facility for increasing creatinine of 5, increased from baseline. Recent bladder resection for tumor on 12/4. Bladder scan this evening 320 mL. Male presents as stated age, in mild acute distress, no respiratory distress, awake, alert, hemodynamically stable. Coude catheter successfully placed with return of cloudy urine after nursing was unable to  pass a foley. Will treat for UTI with rocephin. His bicarb was noted to be decreased, obtained vbg and ph was 7.42, he does not have urgent need for dialysis. Suspect he has post renal obstruction causing decreased renal function. Now that he has catheter in place, will plan for admission and have urology consulted tomorrow for further management.  I consulted medicine for admission, they have evaluated the patient and agree with plan for admission. I discussed this plan with the patient and family, they understand and agree with plan for admission.    Final Clinical Impression(s) / ED Diagnoses Final diagnoses:  AKI (acute kidney injury) [facility])  Urinary retention    Rx / DC Orders ED Discharge Orders    None       Jeyla Bulger, Lovena Le, MD 01/11/19 [place]    Isla Pence, MD 01/11/19 [phone]

## 2019-01-10 NOTE — ED Triage Notes (Signed)
Pt arrives via EMS from [place] where is he receiving rehab. Pt has hx of stage 3 kidney disease. Pt had abnormal labs- BUN 88.7 Creat 5.02. Received 1L NS yesterday with no improvement. Pt is not on dialysis. Pt is alert and oriented. Pt has hx of malignant neoplasm of the bladder per EMS. Pt is bed bound. BP 98/56 (pt's normal), 100% room air, CBG 149, HR 88.

## 2019-01-10 NOTE — H&P (Addendum)
History and Physical    Eliav Linward Foster. [id number] DOB: 03-22-1945 DOA: 01/10/2019  PCP: Katherina Mires, MD Patient coming from: Rehab facility  Chief Complaint: Abnormal labs/elevated creatinine  HPI: Adline Peals. is a 73 y.o. 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  Patient was recently admitted 12/1-12/5 for UTI and AKI thought to be due to possible urinary obstruction.  Discharged home with a Foley catheter and plan for urology follow-up. Patient states he saw urology last Friday and his Foley catheter was removed. Since then he has not had any trouble urinating. Denies dysuria, abdominal pain, or flank pain. Denies fevers or chills. He has no other complaints.  ED Course: Afebrile.  WBC count 15.3.  Bicarb 17, anion gap 16.  BUN 84, creatinine 3.9.  Creatinine was 1.2-1.3 on recent labs.  UA with moderate amount of leukocytes, greater than 50 WBCs, and many bacteria.  Urine culture pending.  Blood culture x2 pending.  Chest x-ray showing no active disease. Patient received ceftriaxone.  Bladder scan with 320 cc urine, Foley placed.  Review of Systems:  All systems reviewed and apart from history of presenting illness, are negative.  Past Medical History:  Diagnosis Date  . Hypertension   . Pneumonia    as a child  . Pre-diabetes     Past Surgical History:  Procedure Laterality Date  . CHOLECYSTECTOMY  2003  . FRACTURE SURGERY  1996   skull fx  . TRANSURETHRAL RESECTION OF BLADDER TUMOR N/A 12/07/2018   Procedure: TRANSURETHRAL RESECTION OF BLADDER TUMOR (TURBT);  Surgeon: Lucas Mallow, MD;  Location: [facility];  Service: Urology;  Laterality: N/A;  . TRANSURETHRAL RESECTION OF BLADDER TUMOR N/A  12/28/2018   Procedure: TRANSURETHRAL RESECTION OF BLADDER TUMOR (TURBT);  Surgeon: Lucas Mallow, MD;  Location: [facility];  Service: Urology;  Laterality: N/A;     reports that he quit smoking about 41 years ago. His smoking use included cigarettes. He has a 15.00 pack-year smoking history. He has never used smokeless tobacco. He reports current alcohol use. He reports previous drug use.  No Known Allergies  History reviewed. No pertinent family history.  Prior to Admission medications   Medication Sig Start Date End Date Taking? Authorizing Provider  citalopram (CELEXA) 20 MG tablet Take 20 mg by mouth daily.  04/29/15   [provider]  fluticasone (FLONASE) 50 MCG/ACT nasal spray Place 2 sprays into both nostrils daily as needed for allergies. 11/24/17   [provider]  hydroxypropyl methylcellulose / hypromellose (ISOPTO TEARS / GONIOVISC) 2.5 % ophthalmic solution Place 1 drop into both eyes 3 (three) times daily as needed for dry eyes.    [provider]  lisinopril-hydrochlorothiazide (PRINZIDE,ZESTORETIC) 10-12.5 MG tablet Take 1 tablet by mouth daily.  04/29/15   [provider]  metFORMIN (GLUCOPHAGE-XR) 500 MG 24 hr tablet Take 500 mg by mouth daily with breakfast.    [provider]  oxybutynin (DITROPAN) 5 MG tablet Take 1 tablet (5 mg total) by mouth every 8 (eight) hours as needed for bladder spasms. 12/08/18   Franchot Gallo, MD    Physical Exam: Vitals:   01/10/19 2215 01/10/19 2230 01/10/19 2245 01/10/19 2300  BP: (!) 114/57 (!) 103/43 (!) 106/50 (!) 100/47  Pulse: 85 83 86 82  Resp: (!) 21 15 19 16   Temp:      TempSrc:      SpO2: 100% 98% 99% 99%    Physical Exam  Constitutional: He is oriented to person, place, and time. He appears well-developed and well-nourished. No distress.  HENT:  Head: Normocephalic.  Eyes: Right eye exhibits no discharge. Left eye exhibits no discharge.  Cardiovascular: Normal rate,  regular rhythm and intact distal pulses.  Pulmonary/Chest: Effort normal and breath sounds normal. No respiratory distress. He has no wheezes. He has no rales.  Abdominal: Soft. Bowel sounds are normal. He exhibits no distension. There is no abdominal tenderness. There is no guarding.  Musculoskeletal:        General: No edema.     Cervical back: Neck supple.  Neurological: He is alert and oriented to person, place, and time.  Skin: Skin is warm and dry. He is not diaphoretic.     Labs on Admission: I have personally reviewed following labs and imaging studies  CBC: Recent Labs  Lab 01/10/19 1809 01/10/19 2018  WBC 15.3*  --   HGB 10.7* 11.2*  HCT 34.2* 33.0*  MCV 91.4  --   PLT 335  --    Basic Metabolic Panel: Recent Labs  Lab 01/10/19 1809 01/10/19 2018  NA 134* 135  K 4.4 4.3  CL 101  --   CO2 17*  --   GLUCOSE 129*  --   BUN 84*  --   CREATININE 3.97*  --   CALCIUM 8.3*  --    GFR: Estimated Creatinine Clearance: 20.2 mL/min (A) (by C-G formula based on SCr of 3.97 mg/dL (H)). Liver Function Tests: No results for input(s): AST, ALT, ALKPHOS, BILITOT, PROT, ALBUMIN in the last 168 hours. No results for input(s): LIPASE, AMYLASE in the last 168 hours. No results for input(s): AMMONIA in the last 168 hours. Coagulation Profile: No results for input(s): INR, PROTIME in the last 168 hours. Cardiac Enzymes: No results for input(s): CKTOTAL, CKMB, CKMBINDEX, TROPONINI in the last 168 hours. BNP (last 3 results) No results for input(s): PROBNP in the last 8760 hours. HbA1C: No results for input(s): HGBA1C in the last 72 hours. CBG: Recent Labs  Lab 01/11/19 0002  GLUCAP 126*   Lipid Profile: No results for input(s): CHOL, HDL, LDLCALC, TRIG, CHOLHDL, LDLDIRECT in the last 72 hours. Thyroid Function Tests: No results for input(s): TSH, T4TOTAL, FREET4, T3FREE, THYROIDAB in the last 72 hours. Anemia Panel: No results for input(s): VITAMINB12, FOLATE,  FERRITIN, TIBC, IRON, RETICCTPCT in the last 72 hours. Urine analysis:    Component Value Date/Time   COLORURINE YELLOW 01/10/2019 2215   APPEARANCEUR TURBID (A) 01/10/2019 2215   LABSPEC 1.012 01/10/2019 2215   PHURINE 6.0 01/10/2019 2215   GLUCOSEU NEGATIVE 01/10/2019 2215   HGBUR LARGE (A) 01/10/2019 2215   BILIRUBINUR NEGATIVE 01/10/2019 2215   KETONESUR 5 (A) 01/10/2019 2215   PROTEINUR 100 (A) 01/10/2019 2215   NITRITE NEGATIVE 01/10/2019 2215   LEUKOCYTESUR MODERATE (A) 01/10/2019 2215    Radiological Exams on Admission: DG Chest Portable 1 View  Result Date: 01/10/2019 CLINICAL DATA:  Hypoxia EXAM: PORTABLE CHEST 1 VIEW COMPARISON:  12/25/2018 FINDINGS: Heart and mediastinal contours are within normal limits. No focal opacities or effusions. No acute bony abnormality. IMPRESSION: No active disease. Electronically Signed   By: Rolm Baptise M.D.   On: 01/10/2019 20:01    Assessment/Plan Principal Problem:   AKI (acute kidney  injury) [facility]) Active Problems:   Bladder tumor   CKD (chronic kidney disease) stage 3, GFR 0000000 ml/min   Complicated UTI (urinary tract infection)   Type 2 diabetes mellitus ([place])   AKI on CKD stage III Prerenal versus post renal. History of bladder tumor status post transurethral resection. Foley placed by ED provider due to concern for acute urinary retention. BUN 84, creatinine 3.9.  Creatinine was 1.2-1.3 on recent labs. Takes lisinopril-hydrochlorothiazide at home which could also be contributing. -Foley in place -Continue to monitor renal function and urine output -Renal ultrasound -Consult urology in a.m. -Hold lisinopril-hydrochlorothiazide  Complicated UTI Patient has a history of transurethral resection of bladder tumor. He was treated for UTI during his recent hospitalization and urine culture at that time grew Proteus vulgaris. At present, UA with evidence of pyuria and bacteriuria. Does have mild leukocytosis but afebrile. No signs of  sepsis. -Ceftriaxone based on prior urine culture sensitivities -Urine culture -Blood culture x2 -Continue to monitor WBC count  Addendum: Concern for sepsis.  Blood pressure downtrending in the ED, systolic currently in the 90s.  Give fluid bolus.  Check lactic acid level.  High anion gap metabolic acidosis Bicarb 17, anion gap 16. Likely related to AKI. -Continue to monitor  Well-controlled non-insulin-dependent diabetes mellitus A1c 6.2 on 11/12. -Sliding scale insulin sensitive and CBG checks  History of bladder cancer Patient underwent transurethral resection of bladder tumor on 12/07/2018. Biopsy revealed high-grade papillary urothelial carcinoma. He again underwent transurethral resection of bladder tumor on 12/28/2018. -Continue urology follow-up  Hypertension Blood pressure slightly soft.  -Hold antihypertensives at this time  Pharmacy med rec pending.  DVT prophylaxis: Subcutaneous heparin Code Status: DNR. Discussed with the patient. Family Communication: No family available. Disposition Plan: Anticipate discharge after clinical improvement. Consults called: None Admission status: It is my clinical opinion that admission to INPATIENT is reasonable and necessary in this 73 y.o. male . presenting with AKI on CKD 3, acute urinary retention, and UTI. Consult urology in a.m.  Given the aforementioned, the predictability of an adverse outcome is felt to be significant. I expect that the patient will require at least 2 midnights in the hospital to treat this condition.   The medical decision making on this patient was of high complexity and the patient is at high risk for clinical deterioration, therefore this is a level 3 visit.  Shela Leff MD [facility] Pager [phone]  If 7PM-7AM, please contact night-coverage [email or web address] Password [place]  01/11/2019, 12:23 AM

## 2019-01-11 ENCOUNTER — Inpatient Hospital Stay (HOSPITAL_COMMUNITY): Payer: Medicare Other

## 2019-01-11 DIAGNOSIS — E119 Type 2 diabetes mellitus without complications: Secondary | ICD-10-CM

## 2019-01-11 DIAGNOSIS — N183 Chronic kidney disease, stage 3 unspecified: Secondary | ICD-10-CM

## 2019-01-11 DIAGNOSIS — N390 Urinary tract infection, site not specified: Secondary | ICD-10-CM

## 2019-01-11 LAB — MRSA PCR SCREENING: MRSA by PCR: NEGATIVE

## 2019-01-11 LAB — BASIC METABOLIC PANEL
Anion gap: 12 (ref 5–15)
BUN: 75 mg/dL — ABNORMAL HIGH (ref 8–23)
CO2: 20 mmol/L — ABNORMAL LOW (ref 22–32)
Calcium: 8.3 mg/dL — ABNORMAL LOW (ref 8.9–10.3)
Chloride: 105 mmol/L (ref 98–111)
Creatinine, Ser: 3.03 mg/dL — ABNORMAL HIGH (ref 0.61–1.24)
GFR calc Af Amer: 22 mL/min — ABNORMAL LOW (ref >60)
GFR calc non Af Amer: 19 mL/min — ABNORMAL LOW (ref >60)
Glucose, Bld: 137 mg/dL — ABNORMAL HIGH (ref 70–99)
Potassium: 4.4 mmol/L (ref 3.5–5.1)
Sodium: 137 mmol/L (ref 135–145)

## 2019-01-11 LAB — CBC
HCT: 31.3 % — ABNORMAL LOW (ref 39.0–52.0)
Hemoglobin: 9.7 g/dL — ABNORMAL LOW (ref 13.0–17.0)
MCH: 28.4 pg (ref 26.0–34.0)
MCHC: 31 g/dL (ref 30.0–36.0)
MCV: 91.8 fL (ref 80.0–100.0)
Platelets: 273 10*3/uL (ref 150–400)
RBC: 3.41 MIL/uL — ABNORMAL LOW (ref 4.22–5.81)
RDW: 18 % — ABNORMAL HIGH (ref 11.5–15.5)
WBC: 10.4 10*3/uL (ref 4.0–10.5)
nRBC: 0 % (ref 0.0–0.2)

## 2019-01-11 LAB — GLUCOSE, CAPILLARY
Glucose-Capillary: 128 mg/dL — ABNORMAL HIGH (ref 70–99)
Glucose-Capillary: 127 mg/dL — ABNORMAL HIGH (ref 70–99)
Glucose-Capillary: 156 mg/dL — ABNORMAL HIGH (ref 70–99)
Glucose-Capillary: 160 mg/dL — ABNORMAL HIGH (ref 70–99)

## 2019-01-11 LAB — LACTIC ACID, PLASMA: Lactic Acid, Venous: 1 mmol/L (ref 0.5–1.9)

## 2019-01-11 LAB — CBG MONITORING, ED: Glucose-Capillary: 126 mg/dL — ABNORMAL HIGH (ref 70–99)

## 2019-01-11 MED ORDER — ENSURE ENLIVE PO LIQD
237.0000 mL | Freq: Three times a day (TID) | ORAL | Status: DC
  Administered 2019-01-11 – 2019-01-12 (×3): 237 mL via ORAL

## 2019-01-11 MED ORDER — SODIUM CHLORIDE 0.9 % IV BOLUS
1000.0000 mL | Freq: Once | INTRAVENOUS | Status: AC
  Administered 2019-01-11: 1000 mL via INTRAVENOUS

## 2019-01-11 MED ORDER — ADULT MULTIVITAMIN W/MINERALS CH
1.0000 | ORAL_TABLET | Freq: Every day | ORAL | Status: DC
  Administered 2019-01-11 – 2019-01-12 (×2): 1 via ORAL
  Filled 2019-01-11 (×2): qty 1

## 2019-01-11 MED ORDER — CHLORHEXIDINE GLUCONATE CLOTH 2 % EX PADS
6.0000 | MEDICATED_PAD | Freq: Every day | CUTANEOUS | Status: DC
  Administered 2019-01-11 – 2019-01-12 (×2): 6 via TOPICAL

## 2019-01-11 MED ORDER — SODIUM CHLORIDE 0.9 % IV SOLN: INTRAVENOUS | Status: AC

## 2019-01-11 NOTE — Evaluation (Signed)
Physical Therapy Evaluation Patient Details Name: [name]. MRN: [id number] DOB: Feb 06, 1945 Today's Date: 01/11/2019   History of Present Illness  73 year old man was admitted from his rehab facility for abnormal lab values, elevated creatinine and noted recent bladder malignancy with admission early Dec 2020 for UTI and AKI.  Has a possible urinary obstruction, catheterized now and is referred to PT for ck of mobility.  PMHx:  FTT, TURP, bladder CA, PNA, HTN, pre diabetic, skull fracture, smoker, CKD  Clinical Impression  Pt was seen for evaluation of mobility and strength, noted his control of standing was decreased by weakness and low endurance.  Follow acutely to work on these deficits, and upgrade his care to SNF for followup of higher care need and two person help for safer gait away from the bed.  See acutely for same.    Follow Up Recommendations SNF    Equipment Recommendations  None recommended by PT    Recommendations for Other Services       Precautions / Restrictions Precautions Precautions: Fall Precaution Comments: foley catheter Restrictions Weight Bearing Restrictions: No      Mobility  Bed Mobility Overal bed mobility: Needs Assistance Bed Mobility: Supine to Sit     Supine to sit: Min assist     General bed mobility comments: min assist to support   Transfers Overall transfer level: Needs assistance Equipment used: Rolling walker (2 wheeled);1 person hand held assist Transfers: Sit to/from Stand Sit to Stand: Min assist;Mod assist         General transfer comment: mod to power up and min to steady him  Ambulation/Gait Ambulation/Gait assistance: Min assist Gait Distance (Feet): 5 Feet Assistive device: Rolling walker (2 wheeled);1 person hand held assist Gait Pattern/deviations: Step-to pattern;Wide base of support;Decreased stride length Gait velocity: reduced, halting Gait velocity interpretation: <1.8 ft/sec, indicate of risk for  recurrent falls General Gait Details: sidesteps on side of bed with RW and care to shift walker with PT helping, very reliant on walker  Stairs            Wheelchair Mobility    Modified Rankin (Stroke Patients Only)       Balance Overall balance assessment: Needs assistance Sitting-balance support: Feet supported Sitting balance-Leahy Scale: Good   Postural control: Posterior lean Standing balance support: Bilateral upper extremity supported;During functional activity Standing balance-Leahy Scale: Poor                               Pertinent Vitals/Pain Pain Assessment: No/denies pain    Home Living Family/patient expects to be discharged to:: Assisted living Living Arrangements: Alone;Other (Comment) Available Help at Discharge: Available PRN/intermittently;Available 24 hours/day Type of Home: Assisted living Home Access: Level entry     Home Layout: One level Home Equipment: Walker - 2 wheels;Cane - single point Additional Comments: has been able to take steps on RW previously    Prior Function Level of Independence: Needs assistance   Gait / Transfers Assistance Needed: RW with supervised help  ADL's / Homemaking Assistance Needed: staff to assist bathing and dressing        Hand Dominance   Dominant Hand: Right    Extremity/Trunk Assessment   Upper Extremity Assessment Upper Extremity Assessment: Generalized weakness    Lower Extremity Assessment Lower Extremity Assessment: Generalized weakness    Cervical / Trunk Assessment Cervical / Trunk Assessment: Kyphotic  Communication   Communication: No difficulties  Cognition Arousal/Alertness: Awake/alert Behavior During Therapy: WFL for tasks assessed/performed Overall Cognitive Status: No family/caregiver present to determine baseline cognitive functioning                                 General Comments: has some minor deficits in his short term memory and recall  of prior function      General Comments General comments (skin integrity, edema, etc.): pt was able to step on walker and to work toward steps away from the bed    Exercises     Assessment/Plan    PT Assessment Patient needs continued PT services  PT Problem List Decreased strength;Decreased range of motion;Decreased activity tolerance;Decreased balance;Decreased mobility;Decreased coordination;Decreased knowledge of use of DME;Decreased safety awareness;Cardiopulmonary status limiting activity       PT Treatment Interventions DME instruction;Gait training;Functional mobility training;Therapeutic activities;Therapeutic exercise;Balance training;Neuromuscular re-education;Patient/family education    PT Goals (Current goals can be found in the Care Plan section)  Acute Rehab PT Goals Patient Stated Goal: none stated PT Goal Formulation: With patient Time For Goal Achievement: 01/25/19 Potential to Achieve Goals: Good    Frequency Min 3X/week   Barriers to discharge Decreased caregiver support decreased caregiver ratio in ALF    Co-evaluation               AM-PAC PT "6 Clicks" Mobility  Outcome Measure Help needed turning from your back to your side while in a flat bed without using bedrails?: A Little Help needed moving from lying on your back to sitting on the side of a flat bed without using bedrails?: A Lot Help needed moving to and from a bed to a chair (including a wheelchair)?: A Lot Help needed standing up from a chair using your arms (e.g., wheelchair or bedside chair)?: A Lot Help needed to walk in hospital room?: A Lot Help needed climbing 3-5 steps with a railing? : Total 6 Click Score: 12    End of Session Equipment Utilized During Treatment: Gait belt Activity Tolerance: Patient limited by fatigue;Treatment limited secondary to medical complications (Comment) Patient left: in bed;with call bell/phone within reach;with bed alarm set Nurse Communication:  Mobility status PT Visit Diagnosis: Other abnormalities of gait and mobility (R26.89);Muscle weakness (generalized) (M62.81);Difficulty in walking, not elsewhere classified (R26.2)    Time: [id number] PT Time Calculation (min) (ACUTE ONLY): 36 min   Charges:   PT Evaluation $PT Eval Moderate Complexity: 1 Mod         Ramond Dial 01/11/2019, 8:29 PM   Mee Hives, PT MS Acute Rehab Dept. Number: [place]

## 2019-01-11 NOTE — Plan of Care (Signed)
  Problem: Education: Goal: Knowledge of General Education information will improve Description: Including pain rating scale, medication(s)/side effects and non-pharmacologic comfort measures Outcome: Progressing   Problem: Clinical Measurements: Goal: Will remain free from infection Outcome: Progressing   

## 2019-01-11 NOTE — ED Provider Notes (Signed)
  Physical Exam  BP 100/60 (BP Location: Right Arm)   Pulse 78   Temp 98.6 F (37 C) (Oral)   Resp 18   Ht 6\' 1"  (1.854 m)   Wt 89.7 kg   SpO2 100%   BMI 26.09 kg/m   Physical Exam  ED Course/Procedures     BLADDER CATHETERIZATION  Date/Time: 01/11/2019 4:04 PM Performed by: Isla Pence, MD Authorized by: Isla Pence, MD   Consent:    Consent obtained:  Verbal   Consent given by:  Patient   Risks discussed:  Infection, pain and false passage   Alternatives discussed:  No treatment Pre-procedure details:    Procedure purpose:  Therapeutic   Preparation: Patient was prepped and draped in usual sterile fashion   Anesthesia (see MAR for exact dosages):    Anesthesia method:  Topical application   Topical anesthetic:  Lidocaine gel Procedure details:    Provider performed due to:  Complicated insertion and nurse unable to complete   Catheter insertion:  Indwelling   Catheter type:  Coude   Catheter size:  18 Fr   Bladder irrigation: no     Number of attempts:  1   Urine characteristics:  Cloudy and foul smelling Post-procedure details:    Patient tolerance of procedure:  Tolerated well, no immediate complications    MDM         Isla Pence, MD 01/11/19 [id number]

## 2019-01-11 NOTE — Progress Notes (Signed)
Initial Nutrition Assessment  DOCUMENTATION CODES:   Not applicable  INTERVENTION:    Ensure Enlive po BID, each supplement provides 350 kcal and 20 grams of protein  MVI with minerals   NUTRITION DIAGNOSIS:   Increased nutrient needs related to acute illness(UTI/AKI) as evidenced by estimated needs.  GOAL:   Patient will meet greater than or equal to 90% of their needs  MONITOR:   PO intake, Supplement acceptance, Weight trends, Labs, I & O's  REASON FOR ASSESSMENT:   Malnutrition Screening Tool    ASSESSMENT:   Patient with PMH significant for HTN, CKD III, bladder cancer s/p transurethral resection of malignant bladder tumor, dm, and recent admit (12/1-12/5) for UTI and AKI. Presents this admission with AKI on CKD III and complicated UTI.   RD working remotely.  Unable to reach pt via phone. No meal completions charted this admission. Pt had Ensure last admission and seemed to like them. Will attempt to obtain nutrition/weight history if possible.   Records indicate pt weighed 101.8 kg on 12/5 and 89.7 kg this admission (11.9% wt loss in two weeks, significant for time frame). Suspect malnutrition but unable to diagnose without NFPE or dietary recall.    I/O: -880 ml since admit  UOP: 2,000 ml x 24 hrs   Medications: SS novolog Labs: Cr 3.03-trending down CBG 127-137   Diet Order:   Diet Order            Diet Carb Modified Fluid consistency: Thin; Room service appropriate? Yes  Diet effective now              EDUCATION NEEDS:   Not appropriate for education at this time  Skin:  Skin Assessment: Skin Integrity Issues: Skin Integrity Issues:: Stage I Stage I: sacrum  Last BM:  PTA  Height:   Ht Readings from Last 1 Encounters:  01/11/19 6\' 1"  (1.854 m)    Weight:   Wt Readings from Last 1 Encounters:  01/11/19 89.7 kg    Ideal Body Weight:  83.6 kg  BMI:  Body mass index is 26.09 kg/m.  Estimated Nutritional Needs:   Kcal:   2300-2500 kcal  Protein:  115-130 grams  Fluid:  >/= 2.3 L/day   Mariana Single RD, LDN Clinical Nutrition Pager # - [phone]

## 2019-01-11 NOTE — ED Notes (Signed)
Pt's bp 89/54. BP trending down. Marlowe Sax, MD paged.

## 2019-01-11 NOTE — ED Notes (Signed)
Tele   Breakfast ordered  

## 2019-01-11 NOTE — ED Notes (Signed)
ED TO INPATIENT HANDOFF REPORT  ED Nurse Name and Phone #:  [phone] [name]., RN  S Name/Age/Gender [name]. 73 y.o. male Room/Bed: 021C/021C  Code Status   Code Status: DNR  Home/SNF/Other SNF Patient oriented to: self, place, time and situation Is this baseline? Yes   Triage Complete: Triage complete  Chief Complaint AKI (acute kidney injury) [name]) [N17.9]  Triage Note Pt arrives via EMS from [place] where is he receiving rehab. Pt has hx of stage 3 kidney disease. Pt had abnormal labs- BUN 88.7 Creat 5.02. Received 1L NS yesterday with no improvement. Pt is not on dialysis. Pt is alert and oriented. Pt has hx of malignant neoplasm of the bladder per EMS. Pt is bed bound. BP 98/56 (pt's normal), 100% room air, CBG 149, HR 88.    Allergies No Known Allergies  Level of Care/Admitting Diagnosis ED Disposition    ED Disposition Condition [facility] Area: [place] [[id number]]  Level of Care: Telemetry Medical [104]  Covid Evaluation: Asymptomatic Screening Protocol (No Symptoms)  Diagnosis: AKI (acute kidney injury) [facility]) [[id number]]  Admitting Physician: Shela Leff [[id number]]  Attending Physician: Shela Leff [[id number]]  Estimated length of stay: past midnight tomorrow  Certification:: I certify this patient will need inpatient services for at least 2 midnights       B Medical/Surgery History Past Medical History:  Diagnosis Date  . Hypertension   . Pneumonia    as a child  . Pre-diabetes    Past Surgical History:  Procedure Laterality Date  . CHOLECYSTECTOMY  2003  . FRACTURE SURGERY  1996   skull fx  . TRANSURETHRAL RESECTION OF BLADDER TUMOR N/A 12/07/2018   Procedure: TRANSURETHRAL RESECTION OF BLADDER TUMOR (TURBT);  Surgeon: Lucas Mallow, MD;  Location: [facility];  Service: Urology;  Laterality: N/A;  . TRANSURETHRAL RESECTION OF BLADDER TUMOR N/A 12/28/2018   Procedure: TRANSURETHRAL  RESECTION OF BLADDER TUMOR (TURBT);  Surgeon: Lucas Mallow, MD;  Location: [facility];  Service: Urology;  Laterality: N/A;     A IV Location/Drains/Wounds Patient Lines/Drains/Airways Status   Active Line/Drains/Airways    Name:   Placement date:   Placement time:   Site:   Days:   Peripheral IV 01/10/19 Right Forearm   01/10/19    1732    Forearm   1   Urethral Catheter Dr. Gilford Raid Coude 20 Fr.   01/10/19    2139    Coude   1   Wound / Incision (Open or Dehisced) 05/30/15 Laceration Arm Right;Anterior   05/30/15    --    Arm   1322          Intake/Output Last 24 hours No intake or output data in the 24 hours ending 01/11/19 0429  Labs/Imaging Results for orders placed or performed during the hospital encounter of 01/10/19 (from the past 48 hour(s))  Basic metabolic panel     Status: Abnormal   Collection Time: 01/10/19  6:09 PM  Result Value Ref Range   Sodium 134 (L) 135 - 145 mmol/L   Potassium 4.4 3.5 - 5.1 mmol/L   Chloride 101 98 - 111 mmol/L   CO2 17 (L) 22 - 32 mmol/L   Glucose, Bld 129 (H) 70 - 99 mg/dL   BUN 84 (H) 8 - 23 mg/dL   Creatinine, Ser 3.97 (H) 0.61 - 1.24 mg/dL   Calcium 8.3 (L) 8.9 - 10.3 mg/dL  GFR calc non Af Amer 14 (L) >60 mL/min   GFR calc Af Amer 16 (L) >60 mL/min   Anion gap 16 (H) 5 - 15    Comment: Performed at [facility] [address], [place], [place] [address]  CBC     Status: Abnormal   Collection Time: 01/10/19  6:09 PM  Result Value Ref Range   WBC 15.3 (H) 4.0 - 10.5 K/uL   RBC 3.74 (L) 4.22 - 5.81 MIL/uL   Hemoglobin 10.7 (L) 13.0 - 17.0 g/dL   HCT 34.2 (L) 39.0 - 52.0 %   MCV 91.4 80.0 - 100.0 fL   MCH 28.6 26.0 - 34.0 pg   MCHC 31.3 30.0 - 36.0 g/dL   RDW 17.9 (H) 11.5 - 15.5 %   Platelets 335 150 - 400 K/uL   nRBC 0.0 0.0 - 0.2 %    Comment: Performed at [facility] [address] [place], [place] [address]  POCT I-Stat EG7     Status: Abnormal   Collection Time: 01/10/19  8:18 PM  Result Value Ref  Range   pH, Ven 7.421 7.250 - 7.430   pCO2, Ven 36.4 (L) 44.0 - 60.0 mmHg   pO2, Ven 31.0 (LL) 32.0 - 45.0 mmHg   Bicarbonate 23.7 20.0 - 28.0 mmol/L   TCO2 25 22 - 32 mmol/L   O2 Saturation 62.0 %   Acid-base deficit 1.0 0.0 - 2.0 mmol/L   Sodium 135 135 - 145 mmol/L   Potassium 4.3 3.5 - 5.1 mmol/L   Calcium, Ion 1.15 1.15 - 1.40 mmol/L   HCT 33.0 (L) 39.0 - 52.0 %   Hemoglobin 11.2 (L) 13.0 - 17.0 g/dL   Patient temperature HIDE    Sample type VENOUS    Comment NOTIFIED PHYSICIAN   POC SARS Coronavirus 2 Ag-ED - Nasal Swab (BD Veritor Kit)     Status: None   Collection Time: 01/10/19  8:53 PM  Result Value Ref Range   SARS Coronavirus 2 Ag NEGATIVE NEGATIVE    Comment: (NOTE) SARS-CoV-2 antigen NOT DETECTED.  Negative results are presumptive.  Negative results do not preclude SARS-CoV-2 infection and should not be used as the sole basis for treatment or other patient management decisions, including infection  control decisions, particularly in the presence of clinical signs and  symptoms consistent with COVID-19, or in those who have been in contact with the virus.  Negative results must be combined with clinical observations, patient history, and epidemiological information. The expected result is Negative. Fact Sheet for Patients: PodPark.tn Fact Sheet for Healthcare Providers: GiftContent.is This test is not yet approved or cleared by the [place] FDA and  has been authorized for detection and/or diagnosis of SARS-CoV-2 by FDA under an Emergency Use Authorization (EUA).  This EUA will remain in effect (meaning this test can be used) for the duration of  the COVID-19 de claration under Section 564(b)(1) of the Act, 21 U.S.C. section 360bbb-3(b)(1), unless the authorization is terminated or revoked sooner.   Urinalysis, Routine w reflex microscopic- may I&O cath if menses     Status: Abnormal   Collection  Time: 01/10/19 10:15 PM  Result Value Ref Range   Color, Urine YELLOW YELLOW   APPearance TURBID (A) CLEAR   Specific Gravity, Urine 1.012 1.005 - 1.030   pH 6.0 5.0 - 8.0   Glucose, UA NEGATIVE NEGATIVE mg/dL   Hgb urine dipstick LARGE (A) NEGATIVE   Bilirubin Urine NEGATIVE NEGATIVE  Ketones, ur 5 (A) NEGATIVE mg/dL   Protein, ur 100 (A) NEGATIVE mg/dL   Nitrite NEGATIVE NEGATIVE   Leukocytes,Ua MODERATE (A) NEGATIVE   RBC / HPF 6-10 0 - 5 RBC/hpf   WBC, UA >50 (H) 0 - 5 WBC/hpf   Bacteria, UA MANY (A) NONE SEEN   WBC Clumps PRESENT    Mucus PRESENT    Hyaline Casts, UA PRESENT     Comment: Performed at [facility] [address] [place], [place] [address]  Respiratory Panel by RT PCR (Flu A&B, Covid) - Urine, Catheterized     Status: None   Collection Time: 01/10/19 10:15 PM   Specimen: Urine, Catheterized  Result Value Ref Range   SARS Coronavirus 2 by RT PCR NEGATIVE NEGATIVE    Comment: (NOTE) SARS-CoV-2 target nucleic acids are NOT DETECTED. The SARS-CoV-2 RNA is generally detectable in upper respiratoy specimens during the acute phase of infection. The lowest concentration of SARS-CoV-2 viral copies this assay can detect is 131 copies/mL. A negative result does not preclude SARS-Cov-2 infection and should not be used as the sole basis for treatment or other patient management decisions. A negative result may occur with  improper specimen collection/handling, submission of specimen other than nasopharyngeal swab, presence of viral mutation(s) within the areas targeted by this assay, and inadequate number of viral copies (<131 copies/mL). A negative result must be combined with clinical observations, patient history, and epidemiological information. The expected result is Negative. Fact Sheet for Patients:  PinkCheek.be Fact Sheet for Healthcare Providers:  GravelBags.it This test is not yet ap proved  or cleared by the [place] FDA and  has been authorized for detection and/or diagnosis of SARS-CoV-2 by FDA under an Emergency Use Authorization (EUA). This EUA will remain  in effect (meaning this test can be used) for the duration of the COVID-19 declaration under Section 564(b)(1) of the Act, 21 U.S.C. section 360bbb-3(b)(1), unless the authorization is terminated or revoked sooner.    Influenza A by PCR NEGATIVE NEGATIVE   Influenza B by PCR NEGATIVE NEGATIVE    Comment: (NOTE) The Xpert Xpress SARS-CoV-2/FLU/RSV assay is intended as an aid in  the diagnosis of influenza from Nasopharyngeal swab specimens and  should not be used as a sole basis for treatment. Nasal washings and  aspirates are unacceptable for Xpert Xpress SARS-CoV-2/FLU/RSV  testing. Fact Sheet for Patients: PinkCheek.be Fact Sheet for Healthcare Providers: GravelBags.it This test is not yet approved or cleared by the [place] FDA and  has been authorized for detection and/or diagnosis of SARS-CoV-2 by  FDA under an Emergency Use Authorization (EUA). This EUA will remain  in effect (meaning this test can be used) for the duration of the  Covid-19 declaration under Section 564(b)(1) of the Act, 21  U.S.C. section 360bbb-3(b)(1), unless the authorization is  terminated or revoked. Performed at [facility], [place] [address], [place], [place] [address]   CBG monitoring, ED     Status: Abnormal   Collection Time: 01/11/19 12:02 AM  Result Value Ref Range   Glucose-Capillary 126 (H) 70 - 99 mg/dL  CBC     Status: Abnormal   Collection Time: 01/11/19  3:36 AM  Result Value Ref Range   WBC 10.4 4.0 - 10.5 K/uL   RBC 3.41 (L) 4.22 - 5.81 MIL/uL   Hemoglobin 9.7 (L) 13.0 - 17.0 g/dL   HCT 31.3 (L) 39.0 - 52.0 %   MCV 91.8 80.0 - 100.0 fL  MCH 28.4 26.0 - 34.0 pg   MCHC 31.0 30.0 - 36.0 g/dL   RDW 18.0 (H) 11.5 - 15.5 %   Platelets 273 150  - 400 K/uL   nRBC 0.0 0.0 - 0.2 %    Comment: Performed at [facility], [place] [address], [place], [place] [address]   US RENAL  Result Date: 01/11/2019 CLINICAL DATA:  Acute kidney injury EXAM: RENAL / URINARY TRACT ULTRASOUND COMPLETE COMPARISON:  December 26, 2018 FINDINGS: Right Kidney: Renal measurements: 13 x 5.6 x 4.4 cm = volume: 166 mL. The kidneys are echogenic bilaterally. There is no hydronephrosis. Left Kidney: Renal measurements: 10.6 x 5 x 3.8 cm = volume: 104 mL. The kidney is echogenic. There is no hydronephrosis. Bladder: The bladder is not well evaluated secondary to the presence of a Foley catheter. Other: None. IMPRESSION: 1. No acute abnormality.  No hydronephrosis. 2. Echogenic kidneys bilaterally which can be seen in patients with medical renal disease. 3. Bladder poorly evaluated secondary to the presence of a Foley catheter. The previously demonstrated potential mass is not visualized on this exam. Electronically Signed   By: Constance Holster M.D.   On: 01/11/2019 00:52   DG Chest Portable 1 View  Result Date: 01/10/2019 CLINICAL DATA:  Hypoxia EXAM: PORTABLE CHEST 1 VIEW COMPARISON:  12/25/2018 FINDINGS: Heart and mediastinal contours are within normal limits. No focal opacities or effusions. No acute bony abnormality. IMPRESSION: No active disease. Electronically Signed   By: Rolm Baptise M.D.   On: 01/10/2019 20:01    Pending Labs Unresulted Labs (From admission, onward)    Start     Ordered   01/11/19 5573  Basic metabolic panel  Tomorrow morning,   R     01/10/19 2347   01/11/19 0228  Lactic acid, plasma  ONCE - STAT,   STAT     01/11/19 0227   01/10/19 2134  Urine culture  ONCE - STAT,   STAT     01/10/19 2134   01/10/19 2134  Culture, blood (routine x 2)  BLOOD CULTURE X 2,   STAT     01/10/19 2134          Vitals/Pain Today's Vitals   01/11/19 0100 01/11/19 0200 01/11/19 0300 01/11/19 0400  BP: (!) 95/52 (!) 96/55 (!) 106/59 (!) 110/53   Pulse: 83 82 82 76  Resp:  18 18 (!) 21  Temp:      TempSrc:      SpO2: 92% 98% 100% 99%    Isolation Precautions No active isolations  Medications Medications  cefTRIAXone (ROCEPHIN) 1 g in sodium chloride 0.9 % 100 mL IVPB (has no administration in time range)  heparin injection 5,000 Units (has no administration in time range)  acetaminophen (TYLENOL) tablet 650 mg (has no administration in time range)    Or  acetaminophen (TYLENOL) suppository 650 mg (has no administration in time range)  insulin aspart (novoLOG) injection 0-9 Units (has no administration in time range)  insulin aspart (novoLOG) injection 0-5 Units (0 Units Subcutaneous Not Given 01/11/19 0004)  lidocaine (XYLOCAINE) 2 % jelly 1 application (1 application Topical Given by Other 01/10/19 2126)  cefTRIAXone (ROCEPHIN) 1 g in sodium chloride 0.9 % 100 mL IVPB (0 g Intravenous Stopped 01/10/19 2358)  sodium chloride 0.9 % bolus 1,000 mL (1,000 mLs Intravenous New Bag/Given 01/11/19 0305)    Mobility walks with device Moderate fall risk   Focused Assessments Cardiac Assessment Handoff:    Lab Results  Component Value Date   CKTOTAL 33 (L) 12/25/2018   No results found for: DDIMER Does the Patient currently have chest pain? No  , Renal Assessment Handoff:  Hemodialysis Schedule:  Last Hemodialysis date and time:    Restricted appendage:      R Recommendations: See Admitting Provider Note  Report given to:   Additional Notes:

## 2019-01-11 NOTE — Progress Notes (Signed)
PROGRESS NOTE    [name].  [id number] DOB: 02/17/1945 DOA: 01/10/2019 PCP: Katherina Mires, MD    Brief Narrative: 73 y.o. 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  Patient was recently admitted 12/1-12/5 for UTI and AKI thought to be due to possible urinary obstruction.  Discharged home with a Foley catheter and plan for urology follow-up. Patient states he saw urology last Friday and his Foley catheter was removed. Since then he has not had any trouble urinating. Denies dysuria, abdominal pain, or flank pain. Denies fevers or chills. He has no other complaints.  ED Course: Afebrile.  WBC count 15.3.  Bicarb 17, anion gap 16.  BUN 84, creatinine 3.9.  Creatinine was 1.2-1.3 on recent labs.  UA with moderate amount of leukocytes, greater than 50 WBCs, and many bacteria.  Urine culture pending.  Blood culture x2 pending.  Chest x-ray showing no active disease. Patient received ceftriaxone.  Bladder scan with 320 cc urine, Foley placed.   Assessment & Plan:   Principal Problem:   AKI (acute kidney injury) ([place]) Active Problems:   Bladder tumor   CKD (chronic kidney disease) stage 3, GFR 0000000 ml/min   Complicated UTI (urinary tract infection)   Type 2 diabetes mellitus (HCC)    #1 AKI on CKD stage III in the setting of ACE inhibitor and HCTZ at home with recent transurethral resection of prostate.  Patient had Foley placed in the ER due to possible concern for urinary retention.  Bladder scan showed 320 cc upon admission.  Continue to hold lisinopril and hydrochlorothiazide and monitor renal functions daily.  His creatinine is already improving from 3.9-3.04. Renal ultrasound shows no acute abnormality no  hydronephrosis.  #2 possible UTI urine culture this admission is pending.  He has been on Rocephin based on previous urine culture he had Proteus.  Follow-up blood culture urine culture tomorrow.  Discussed with Dr. Gloriann Loan.  He advised to keep the Foley catheter in place at the time of discharge.  This is a second time he is getting admitted with urinary retention since resection of the bladder tumor.  #3 type 2 diabetes continue SSI blood sugar stable between 1 31-2 05.  #4 history of bladder cancer patient had transurethral resection of bladder tumor twice 11/13 and then on December 4 of this year.  He was admitted twice since then for urinary retention.  He will follow-up with urology upon discharge.  #5 hypertension hold antihypertensives his blood pressure is soft.  100/60.  We will give bolus of normal saline.   Pressure Injury 01/11/19 Sacrum Stage 1 -  Intact skin with non-blanchable redness of a localized area usually over a bony prominence. (Active)  01/11/19 0512  Location: Sacrum  Location Orientation:   Staging: Stage 1 -  Intact skin with non-blanchable redness of a localized area usually over a bony prominence.  Wound Description (Comments):   Present on Admission: Yes      Nutrition Problem: Increased nutrient needs Etiology: acute illness(UTI/AKI)     Signs/Symptoms: estimated needs    Interventions: Ensure Enlive (each supplement provides 350kcal and 20 grams of protein), MVI  Estimated body mass index is 26.09 kg/m as calculated from the following:   Height as of this encounter: 6\' 1"  (1.854  m).   Weight as of this encounter: 89.7 kg.  DVT prophylaxis: Subcu heparin  code Status: DO NOT RESUSCITATE  family Communication: None Disposition Plan: Pending improvement in his renal functions.   Consultants:   Discussed with Dr. Gloriann Loan over the phone  Procedures: None Antimicrobials: Rocephin  Subjective: Sitting up in bed trying to eat breakfast reports  that his appetite is poor  Objective: Vitals:   01/11/19 0300 01/11/19 0400 01/11/19 0507 01/11/19 0903  BP: (!) 106/59 (!) 110/53  100/60  Pulse: 82 76  78  Resp: 18 (!) 21  18  Temp:    98.6 F (37 C)  TempSrc:    Oral  SpO2: 100% 99%  100%  Weight:   89.7 kg   Height:   6\' 1"  (1.854 m)     Intake/Output Summary (Last 24 hours) at 01/11/2019 1408 Last data filed at 01/11/2019 0900 Gross per 24 hour  Intake 1120 ml  Output 2000 ml  Net -880 ml   Filed Weights   01/11/19 0507  Weight: 89.7 kg    Examination:  General exam: Appears calm and comfortable  Respiratory system: Clear to auscultation. Respiratory effort normal. Cardiovascular system: S1 & S2 heard, RRR. No JVD, murmurs, rubs, gallops or clicks. No pedal edema. Gastrointestinal system: Abdomen is nondistended, soft and nontender. No organomegaly or masses felt. Normal bowel sounds heard. Central nervous system: Alert and oriented. No focal neurological deficits. Extremities: Symmetric 5 x 5 power. Skin: No rashes, lesions or ulcers Psychiatry: Judgement and insight appear normal. Mood & affect appropriate.     Data Reviewed: I have personally reviewed following labs and imaging studies  CBC: Recent Labs  Lab 01/10/19 1809 01/10/19 2018 01/11/19 0336  WBC 15.3*  --  10.4  HGB 10.7* 11.2* 9.7*  HCT 34.2* 33.0* 31.3*  MCV 91.4  --  91.8  PLT 335  --  123456   Basic Metabolic Panel: Recent Labs  Lab 01/10/19 1809 01/10/19 2018 01/11/19 0336  NA 134* 135 137  K 4.4 4.3 4.4  CL 101  --  105  CO2 17*  --  20*  GLUCOSE 129*  --  137*  BUN 84*  --  75*  CREATININE 3.97*  --  3.03*  CALCIUM 8.3*  --  8.3*   GFR: Estimated Creatinine Clearance: 23.8 mL/min (A) (by C-G formula based on SCr of 3.03 mg/dL (H)). Liver Function Tests: No results for input(s): AST, ALT, ALKPHOS, BILITOT, PROT, ALBUMIN in the last 168 hours. No results for input(s): LIPASE, AMYLASE in the last 168 hours. No results for  input(s): AMMONIA in the last 168 hours. Coagulation Profile: No results for input(s): INR, PROTIME in the last 168 hours. Cardiac Enzymes: No results for input(s): CKTOTAL, CKMB, CKMBINDEX, TROPONINI in the last 168 hours. BNP (last 3 results) No results for input(s): PROBNP in the last 8760 hours. HbA1C: No results for input(s): HGBA1C in the last 72 hours. CBG: Recent Labs  Lab 01/11/19 0002 01/11/19 0736 01/11/19 1135  GLUCAP 126* 128* 127*   Lipid Profile: No results for input(s): CHOL, HDL, LDLCALC, TRIG, CHOLHDL, LDLDIRECT in the last 72 hours. Thyroid Function Tests: No results for input(s): TSH, T4TOTAL, FREET4, T3FREE, THYROIDAB in the last 72 hours. Anemia Panel: No results for input(s): VITAMINB12, FOLATE, FERRITIN, TIBC, IRON, RETICCTPCT in the last 72 hours. Sepsis Labs: Recent Labs  Lab 01/11/19 0336  LATICACIDVEN 1.0    Recent Results (from the past 240 hour(s))  Respiratory Panel by  RT PCR (Flu A&B, Covid) - Urine, Catheterized     Status: None   Collection Time: 01/10/19 10:15 PM   Specimen: Urine, Catheterized  Result Value Ref Range Status   SARS Coronavirus 2 by RT PCR NEGATIVE NEGATIVE Final    Comment: (NOTE) SARS-CoV-2 target nucleic acids are NOT DETECTED. The SARS-CoV-2 RNA is generally detectable in upper respiratoy specimens during the acute phase of infection. The lowest concentration of SARS-CoV-2 viral copies this assay can detect is 131 copies/mL. A negative result does not preclude SARS-Cov-2 infection and should not be used as the sole basis for treatment or other patient management decisions. A negative result may occur with  improper specimen collection/handling, submission of specimen other than nasopharyngeal swab, presence of viral mutation(s) within the areas targeted by this assay, and inadequate number of viral copies (<131 copies/mL). A negative result must be combined with clinical observations, patient history, and  epidemiological information. The expected result is Negative. Fact Sheet for Patients:  PinkCheek.be Fact Sheet for Healthcare Providers:  GravelBags.it This test is not yet ap proved or cleared by the [place] FDA and  has been authorized for detection and/or diagnosis of SARS-CoV-2 by FDA under an Emergency Use Authorization (EUA). This EUA will remain  in effect (meaning this test can be used) for the duration of the COVID-19 declaration under Section 564(b)(1) of the Act, 21 U.S.C. section 360bbb-3(b)(1), unless the authorization is terminated or revoked sooner.    Influenza A by PCR NEGATIVE NEGATIVE Final   Influenza B by PCR NEGATIVE NEGATIVE Final    Comment: (NOTE) The Xpert Xpress SARS-CoV-2/FLU/RSV assay is intended as an aid in  the diagnosis of influenza from Nasopharyngeal swab specimens and  should not be used as a sole basis for treatment. Nasal washings and  aspirates are unacceptable for Xpert Xpress SARS-CoV-2/FLU/RSV  testing. Fact Sheet for Patients: PinkCheek.be Fact Sheet for Healthcare Providers: GravelBags.it This test is not yet approved or cleared by the [place] FDA and  has been authorized for detection and/or diagnosis of SARS-CoV-2 by  FDA under an Emergency Use Authorization (EUA). This EUA will remain  in effect (meaning this test can be used) for the duration of the  Covid-19 declaration under Section 564(b)(1) of the Act, 21  U.S.C. section 360bbb-3(b)(1), unless the authorization is  terminated or revoked. Performed at [facility], [place] [address], [place], [place] [address]   MRSA PCR Screening     Status: None   Collection Time: 01/11/19  5:42 AM   Specimen: Nasal Mucosa; Nasopharyngeal  Result Value Ref Range Status   MRSA by PCR NEGATIVE NEGATIVE Final    Comment:        The GeneXpert MRSA Assay  (FDA approved for NASAL specimens only), is one component of a comprehensive MRSA colonization surveillance program. It is not intended to diagnose MRSA infection nor to guide or monitor treatment for MRSA infections. Performed at [facility], [place] [address], [place], [place] [address]          Radiology Studies: US RENAL  Result Date: 01/11/2019 CLINICAL DATA:  Acute kidney injury EXAM: RENAL / URINARY TRACT ULTRASOUND COMPLETE COMPARISON:  December 26, 2018 FINDINGS: Right Kidney: Renal measurements: 13 x 5.6 x 4.4 cm = volume: 166 mL. The kidneys are echogenic bilaterally. There is no hydronephrosis. Left Kidney: Renal measurements: 10.6 x 5 x 3.8 cm = volume: 104 mL. The kidney is echogenic. There is no hydronephrosis. Bladder: The bladder  is not well evaluated secondary to the presence of a Foley catheter. Other: None. IMPRESSION: 1. No acute abnormality.  No hydronephrosis. 2. Echogenic kidneys bilaterally which can be seen in patients with medical renal disease. 3. Bladder poorly evaluated secondary to the presence of a Foley catheter. The previously demonstrated potential mass is not visualized on this exam. Electronically Signed   By: Constance Holster M.D.   On: 01/11/2019 00:52   DG Chest Portable 1 View  Result Date: 01/10/2019 CLINICAL DATA:  Hypoxia EXAM: PORTABLE CHEST 1 VIEW COMPARISON:  12/25/2018 FINDINGS: Heart and mediastinal contours are within normal limits. No focal opacities or effusions. No acute bony abnormality. IMPRESSION: No active disease. Electronically Signed   By: Rolm Baptise M.D.   On: 01/10/2019 20:01        Scheduled Meds: . Chlorhexidine Gluconate Cloth  6 each Topical Daily  . feeding supplement (ENSURE ENLIVE)  237 mL Oral TID BM  . heparin  5,000 Units Subcutaneous Q8H  . insulin aspart  0-5 Units Subcutaneous QHS  . insulin aspart  0-9 Units Subcutaneous TID WC  . multivitamin with minerals  1 tablet Oral Daily   Continuous  Infusions: . cefTRIAXone (ROCEPHIN)  IV       LOS: 1 day     Georgette Shell, MD [facility]  If 7PM-7AM, please contact night-coverage [email or web address] Password [email or web address] 01/11/2019, 2:08 PM

## 2019-01-11 NOTE — Plan of Care (Signed)
  Problem: Pain Managment: Goal: General experience of comfort will improve Outcome: Progressing   

## 2019-01-12 LAB — BASIC METABOLIC PANEL
Anion gap: 8 (ref 5–15)
BUN: 54 mg/dL — ABNORMAL HIGH (ref 8–23)
CO2: 24 mmol/L (ref 22–32)
Calcium: 8.4 mg/dL — ABNORMAL LOW (ref 8.9–10.3)
Chloride: 106 mmol/L (ref 98–111)
Creatinine, Ser: 1.91 mg/dL — ABNORMAL HIGH (ref 0.61–1.24)
GFR calc Af Amer: 39 mL/min — ABNORMAL LOW (ref >60)
GFR calc non Af Amer: 34 mL/min — ABNORMAL LOW (ref >60)
Glucose, Bld: 148 mg/dL — ABNORMAL HIGH (ref 70–99)
Potassium: 3.9 mmol/L (ref 3.5–5.1)
Sodium: 138 mmol/L (ref 135–145)

## 2019-01-12 LAB — CBC WITH DIFFERENTIAL/PLATELET
Abs Immature Granulocytes: 0.19 10*3/uL — ABNORMAL HIGH (ref 0.00–0.07)
Basophils Absolute: 0 10*3/uL (ref 0.0–0.1)
Basophils Relative: 0 %
Eosinophils Absolute: 0.1 10*3/uL (ref 0.0–0.5)
Eosinophils Relative: 1 %
HCT: 29.2 % — ABNORMAL LOW (ref 39.0–52.0)
Hemoglobin: 9 g/dL — ABNORMAL LOW (ref 13.0–17.0)
Immature Granulocytes: 2 %
Lymphocytes Relative: 21 %
Lymphs Abs: 1.7 10*3/uL (ref 0.7–4.0)
MCH: 27.9 pg (ref 26.0–34.0)
MCHC: 30.8 g/dL (ref 30.0–36.0)
MCV: 90.4 fL (ref 80.0–100.0)
Monocytes Absolute: 0.8 10*3/uL (ref 0.1–1.0)
Monocytes Relative: 10 %
Neutro Abs: 5.3 10*3/uL (ref 1.7–7.7)
Neutrophils Relative %: 66 %
Platelets: 249 10*3/uL (ref 150–400)
RBC: 3.23 MIL/uL — ABNORMAL LOW (ref 4.22–5.81)
RDW: 17.9 % — ABNORMAL HIGH (ref 11.5–15.5)
WBC: 8.1 10*3/uL (ref 4.0–10.5)
nRBC: 0 % (ref 0.0–0.2)

## 2019-01-12 LAB — GLUCOSE, CAPILLARY
Glucose-Capillary: 118 mg/dL — ABNORMAL HIGH (ref 70–99)
Glucose-Capillary: 185 mg/dL — ABNORMAL HIGH (ref 70–99)

## 2019-01-12 MED ORDER — CEFDINIR 300 MG PO CAPS: 300.0000 mg | ORAL_CAPSULE | Freq: Two times a day (BID) | ORAL | 0 refills | Status: DC

## 2019-01-12 NOTE — TOC Transition Note (Signed)
Transition of Care [facility]) - CM/SW Discharge Note   Patient Details  Name: [name]. MRN: [id number] Date of Birth: 01-Jun-1945  Transition of Care [facility]) CM/SW Contact:  Gelene Mink, [place] Phone Number: 01/12/2019, 12:54 PM   Clinical Narrative:     Patient will DC to: Anticipated DC date: Family notified: Transport by:   Per MD patient ready for DC to . RN, patient, patient's family, and facility notified of DC. Discharge Summary and FL2 sent to facility. RN to call report prior to discharge ([phone], Room 119). DC packet on chart. Ambulance transport requested for patient.   CSW will sign off for now as social work intervention is no longer needed. Please consult [place] again if new needs arise.  Jarman Litton, LCSW-A [facility]/Clinical Social Work Department Cell: [phone]    Final next level of care: [place] Barriers to Discharge: No Barriers Identified   Patient Goals and CMS Choice Patient states their goals for this hospitalization and ongoing recovery are:: Pt [facility] to [facility].gov Compare Post Acute Care list provided to:: Other (Comment Required) Choice offered to / list presented to : NA  Discharge Placement   Existing PASRR number confirmed : 01/12/19          Patient chooses bed at: Other - please specify in the comment section below:([place]) Patient to be transferred to facility by: [place] Name of family member notified: Collie Siad Patient and family notified of of transfer: 01/12/19  Discharge Plan and Services     Post Acute Care Choice: [place]          DME Arranged: N/A DME Agency: NA       HH Arranged: NA HH Agency: NA        Social Determinants of Health (SDOH) Interventions     Readmission Risk Interventions No flowsheet data found.

## 2019-01-12 NOTE — NC FL2 (Signed)
[place] LEVEL OF CARE SCREENING TOOL     IDENTIFICATION  Patient Name: [name]. Birthdate: [birth year] Sex: male Admission Date (Current Location): 01/10/2019  [facility] [place] Number:  Herbalist and Address:  [facility] [place], [place] [address], [place], [place] [address]      Provider Number: [id number]  Attending Physician Name and Address:  Georgette Shell, MD  Relative Name and Phone Number:  Vanetta Shawl, [phone]    Current Level of Care: Hospital Recommended Level of Care: [place] Prior Approval Number:    Date Approved/Denied:   PASRR Number: [id number]  Discharge Plan:      Current Diagnoses: Patient Active Problem List   Diagnosis Date Noted  . CKD (chronic kidney disease) stage 3, GFR 30-59 ml/min 01/11/2019  . Complicated UTI (urinary tract infection) 01/11/2019  . Type 2 diabetes mellitus ([place]) 01/11/2019  . AKI (acute kidney injury) ([place]) 01/10/2019  . FTT (failure to thrive) in adult 12/25/2018  . Bladder tumor 12/07/2018    Orientation RESPIRATION BLADDER Height & Weight     Self, Place  Normal Continent, Indwelling catheter Weight: 197 lb 12 oz (89.7 kg) Height:  6\' 1"  (185.4 cm)  BEHAVIORAL SYMPTOMS/MOOD NEUROLOGICAL BOWEL NUTRITION STATUS      Continent Diet(carb modified diet, thin liquids)  AMBULATORY STATUS COMMUNICATION OF NEEDS Skin   Extensive Assist   Other (Comment), Bruising(pressure wound on sacrum (dressing in place, change every 3 days); bruising on arm/leg/knee)                       Personal Care Assistance Level of Assistance  Bathing, Feeding, Dressing Bathing Assistance: Maximum assistance Feeding assistance: Limited assistance Dressing Assistance: Maximum assistance     Functional Limitations Info  Sight, Hearing, Speech Sight Info: Adequate Hearing Info: Impaired Speech Info: Adequate    SPECIAL CARE FACTORS FREQUENCY   PT (By licensed PT), OT (By licensed OT)     PT Frequency: 5x/wk OT Frequency: 5x/wk            Contractures Contractures Info: Not present    Additional Factors Info  Code Status, Allergies, Insulin Sliding Scale Code Status Info: DNR Allergies Info: No Known Allergies   Insulin Sliding Scale Info: insulin aspart novolog 0-5 units at bedtime and insulin aspart novolog 0-9 units 3x daily w/meals       Current Medications (01/12/2019):  This is the current hospital active medication list Current Facility-Administered Medications  Medication Dose Route Frequency Provider Last Rate Last Admin  . acetaminophen (TYLENOL) tablet 650 mg  650 mg Oral Q6H PRN Shela Leff, MD       Or  . acetaminophen (TYLENOL) suppository 650 mg  650 mg Rectal Q6H PRN Shela Leff, MD      . cefTRIAXone (ROCEPHIN) 1 g in sodium chloride 0.9 % 100 mL IVPB  1 g Intravenous Q24H Shela Leff, MD   Stopped at 01/11/19 2230  . Chlorhexidine Gluconate Cloth 2 % PADS 6 each  6 each Topical Daily Shela Leff, MD   6 each at 01/12/19 0912  . feeding supplement (ENSURE ENLIVE) (ENSURE ENLIVE) liquid 237 mL  237 mL Oral TID BM Georgette Shell, MD   237 mL at 01/12/19 1223  . heparin injection 5,000 Units  5,000 Units Subcutaneous Q8H Shela Leff, MD   5,000 Units at 01/12/19 1219  . insulin aspart (novoLOG) injection 0-5 Units  0-5  Units Subcutaneous QHS Shela Leff, MD      . insulin aspart (novoLOG) injection 0-9 Units  0-9 Units Subcutaneous TID WC Shela Leff, MD   2 Units at 01/12/19 1219  . multivitamin with minerals tablet 1 tablet  1 tablet Oral Daily Georgette Shell, MD   1 tablet at 01/12/19 [id number]     Discharge Medications: Please see discharge summary for a list of discharge medications.  Relevant Imaging Results:  Relevant Lab Results:   Additional Information SSN: [id number]  [place]

## 2019-01-12 NOTE — Progress Notes (Signed)
DISCHARGE NOTE SNF [name]. to be discharged Skilled nursing facility per MD order. Patient verbalized understanding.  Skin clean, dry and intact without evidence of skin break down, no evidence of skin tears noted. IV catheter discontinued intact. Site without signs and symptoms of complications. Dressing and pressure applied. Pt denies pain at the site currently. No complaints noted.  D/C with foley per order.   Discharge packet assembled. An After Visit Summary (AVS) was printed and given to the EMS personnel. Patient escorted via stretcher and discharged to [facility] via ambulance. Report called to accepting facility; all questions and concerns addressed.   Aneta Mins BSN, RN3

## 2019-01-12 NOTE — TOC Initial Note (Signed)
Transition of Care (TOC) - Initial/Assessment Note    Patient Details  Name: [name]. MRN: [id number] Date of Birth: [birth year]  Transition of Care [facility]) CM/SW Contact:    Gelene Mink, [place] Phone Number: 01/12/2019, 12:52 PM  Clinical Narrative:                  Patient [facility].   Discharge note to follow.   Expected Discharge Plan: Skilled Nursing Facility Barriers to Discharge: No Barriers Identified   Patient Goals and CMS Choice Patient states their goals for this hospitalization and ongoing recovery are:: Pt will return back to his SNF   Choice offered to / list presented to : NA  Expected Discharge Plan and Services Expected Discharge Plan: [place] Choice: [place] Living arrangements for the past 2 months: [place], [place] Expected Discharge Date: 01/12/19                                    Prior Living Arrangements/Services Living arrangements for the past 2 months: [place], [place] Lives with:: Self Patient language and need for interpreter reviewed:: No Do you feel safe going back to the place where you live?: Yes      Need for Family Participation in Patient Care: No (Comment) Care giver support system in place?: No (comment)   Criminal Activity/Legal Involvement Pertinent to Current Situation/Hospitalization: No - Comment as needed  Activities of Daily Living Home Assistive Devices/Equipment: Eyeglasses ADL Screening (condition at time of admission) Patient's cognitive ability adequate to safely complete daily activities?: Yes Is the patient deaf or have difficulty hearing?: Yes Does the patient have difficulty seeing, even when wearing glasses/contacts?: No Does the patient have difficulty concentrating, remembering, or making decisions?: No Patient able to express need for assistance with  ADLs?: Yes Does the patient have difficulty dressing or bathing?: Yes Independently performs ADLs?: No Communication: Independent Dressing (OT): Needs assistance Is this a change from baseline?: Change from baseline, expected to last <3days Grooming: Needs assistance Is this a change from baseline?: Change from baseline, expected to last <3 days Feeding: Independent Bathing: Needs assistance Is this a change from baseline?: Change from baseline, expected to last <3 days Toileting: Needs assistance Is this a change from baseline?: Change from baseline, expected to last <3 days In/Out Bed: Needs assistance Is this a change from baseline?: Change from baseline, expected to last <3 days Walks in Home: Needs assistance Is this a change from baseline?: Change from baseline, expected to last <3 days Does the patient have difficulty walking or climbing stairs?: Yes Weakness of Legs: Both Weakness of Arms/Hands: Both  Permission Sought/Granted Permission sought to share information with : Case Manager Permission granted to share information with : Yes, Verbal Permission Granted     Permission granted to share info w AGENCY: [employer]        Emotional Assessment Appearance:: Appears stated age Attitude/Demeanor/Rapport: Unable to Assess Affect (typically observed): Unable to Assess Orientation: : Oriented to Self, Oriented to Place Alcohol / Substance Use: Not Applicable Psych Involvement: No (comment)  Admission diagnosis:  AKI (acute kidney injury) ([place]) [N17.9] Patient Active Problem List   Diagnosis Date Noted  . CKD (chronic kidney disease) stage 3, GFR 30-59 ml/min 01/11/2019  . Complicated UTI (urinary tract infection)  01/11/2019  . Type 2 diabetes mellitus ([place]) 01/11/2019  . AKI (acute kidney injury) ([place]) 01/10/2019  . FTT (failure to thrive) in adult 12/25/2018  . Bladder tumor 12/07/2018   PCP:  Katherina Mires, MD Pharmacy:   [facility]  [place]), [place] - [id number] [place] ([place]) [place] [address] Phone: [phone] Fax: [phone]     Social Determinants of Health (SDOH) Interventions    Readmission Risk Interventions No flowsheet data found.

## 2019-01-12 NOTE — Progress Notes (Signed)
Report called to [place], Therapist, sports at [facility].

## 2019-01-12 NOTE — Discharge Summary (Signed)
Physician Discharge Summary  [name]. [id number] DOB: [birth year] DOA: 01/10/2019  PCP: Katherina Mires, MD  Admit date: 01/10/2019 Discharge date: 01/12/2019  Admitted From: [facility] Disposition: [facility]  Recommendations for Outpatient Follow-up:  1. Follow up with PCP in 1-2 weeks 2. Please obtain BMP/CBC in 3 days to follow-up on renal functions 3. Please follow-up with [facility] Dr. 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 Patient was recently admitted 12/1-12/5 for UTI and AKI thought to be due to possible urinary obstruction. Discharged home with a Foley catheter and plan for urology follow-up.Patient states he saw urology last Friday and his Foley catheter was removed. Since then he has not had any trouble urinating. Denies dysuria, abdominal pain, or flank pain. Denies fevers or chills. He has no other complaints.  ED Course:Afebrile. WBC count 15.3. Bicarb 17, anion gap 16. BUN 84, creatinine 3.9. Creatinine was 1.2-1.3 on recent labs. UA with moderate amount of leukocytes, greater than 50 WBCs, and many bacteria. Urine culture pending. Blood culture x2 pending. Chest x-ray showing no active disease. Patient received ceftriaxone. Bladder scan with 320 cc urine, Foley placed.    Discharge Diagnoses:  Principal Problem:   AKI (acute kidney injury) ([place]) Active Problems:   Bladder tumor   CKD (chronic kidney disease) stage 3, GFR 0000000 ml/min   Complicated UTI (urinary tract  infection)   Type 2 diabetes mellitus (HCC)   #1 AKI on CKD stage III in the setting of ACE inhibitor and HCTZ at home with recent transurethral resection of prostate.  Patient had Foley placed in the ER due to  for urinary retention.  Bladder scan showed 320 cc upon admission.  Lisinopril and hydrochlorothiazide and Metformin was stopped during this hospital stay.  His creatinine improved from 3.9 at the time of admission to 1.9 at the time of discharge.  He will be discharged back to [place] today please continue to hold his lisinopril HCTZ and Metformin.  His blood pressure is normal to soft to restart his antihypertensives at this time. Renal ultrasound shows no acute abnormality no hydronephrosis.  #2UTI urine culture growing Proteus vulgaris again.  Will DC him on Omnicef.   #3 type 2 diabetes-his blood sugar has been stable without Metformin I have stopped Metformin due to AKI.  Please follow his blood sugar at the facility and consider starting him on glipizide if needed.   #4 history of bladder cancer patient had transurethral resection of bladder tumor twice 11/13 and then on December 4 of this year.  He was admitted twice since then for urinary retention.  He will follow-up with urology upon discharge.  Please have him follow-up with urology in 2 to 4 weeks.  Do not take Foley out until seen by urology.  #5 hypertension hold antihypertensives his blood pressure is soft.  102/57  Pressure Injury 01/11/19 Sacrum Stage 1 -  Intact skin with non-blanchable redness of a localized area usually over a bony prominence. (Active)  01/11/19 0512  Location: Sacrum  Location Orientation:   Staging: Stage 1 -  Intact skin with non-blanchable redness of a localized area  usually over a bony prominence.  Wound Description (Comments):   Present on Admission: Yes      Nutrition Problem: Increased nutrient needs Etiology: acute illness(UTI/AKI)    Signs/Symptoms: estimated  needs     Interventions: Ensure Enlive (each supplement provides 350kcal and 20 grams of protein), MVI  Estimated body mass index is 26.09 kg/m as calculated from the following:   Height as of this encounter: 6\' 1"  (1.854 m).   Weight as of this encounter: 89.7 kg.  Discharge Instructions  Discharge Instructions    Call MD for:  difficulty breathing, headache or visual disturbances   Complete by: As directed    Call MD for:  persistant nausea and vomiting   Complete by: As directed    Call MD for:  severe uncontrolled pain   Complete by: As directed    Diet - low sodium heart healthy   Complete by: As directed    Increase activity slowly   Complete by: As directed      Allergies as of 01/12/2019   No Known Allergies     Medication List    STOP taking these medications   lisinopril-hydrochlorothiazide 10-12.5 MG tablet Commonly known as: ZESTORETIC   metFORMIN 500 MG 24 hr tablet Commonly known as: GLUCOPHAGE-XR     TAKE these medications   citalopram 20 MG tablet Commonly known as: CELEXA Take 20 mg by mouth daily.   fluticasone 50 MCG/ACT nasal spray Commonly known as: FLONASE Place 2 sprays into both nostrils daily as needed for allergies.   hydroxypropyl methylcellulose / hypromellose 2.5 % ophthalmic solution Commonly known as: ISOPTO TEARS / GONIOVISC Place 1 drop into both eyes 3 (three) times daily as needed for dry eyes.   ondansetron 4 MG tablet Commonly known as: ZOFRAN Take 4 mg by mouth every 8 (eight) hours as needed for nausea or vomiting.   oxybutynin 5 MG 24 hr tablet Commonly known as: DITROPAN-XL Take 5 mg by mouth every 8 (eight) hours as needed (Bladder spasms). What changed: Another medication with the same name was removed. Continue taking this medication, and follow the directions you see here.      Follow-up Information    Katherina Mires, MD Follow up.   Specialty: Family Medicine Contact information: [place] [address] [phone]        Lucas Mallow, MD Follow up.   Specialty: Urology Contact information: [place] [address] [phone]          No Known Allergies  Consultations:  Discussed with Dr. Gloriann Loan on the phone   Procedures/Studies: US RENAL  Result Date: 01/11/2019 CLINICAL DATA:  Acute kidney injury EXAM: RENAL / URINARY TRACT ULTRASOUND COMPLETE COMPARISON:  December 26, 2018 FINDINGS: Right Kidney: Renal measurements: 13 x 5.6 x 4.4 cm = volume: 166 mL. The kidneys are echogenic bilaterally. There is no hydronephrosis. Left Kidney: Renal measurements: 10.6 x 5 x 3.8 cm = volume: 104 mL. The kidney is echogenic. There is no hydronephrosis. Bladder: The bladder is not well evaluated secondary to the presence of a Foley catheter. Other: None. IMPRESSION: 1. No acute abnormality.  No hydronephrosis. 2. Echogenic kidneys bilaterally which can be seen in patients with medical renal disease. 3. Bladder poorly evaluated secondary to the presence of a Foley catheter. The previously demonstrated potential mass is not visualized on this exam. Electronically Signed   By: Constance Holster M.D.   On: 01/11/2019 00:52  US Renal  Result Date: 12/26/2018 CLINICAL DATA:  Acute kidney injury.  Urinary tract infection. EXAM: RENAL / URINARY TRACT ULTRASOUND COMPLETE COMPARISON:  None. FINDINGS: Right Kidney: Renal measurements: 0.0 x 5.1 x 4.7 cm = volume: 151.3 mL. There is some thinning of the normal renal parenchyma. Renal parenchyma is isoechoic to the index organ, the liver. Liver is mildly hyperechoic. No focal lesions are present in the kidney. There is no stone or mass lesion. No hydronephrosis is present. Left Kidney: Renal measurements: 10.0 x 6.2 x 4.9 cm = volume: 159.3 mL. There is some thinning of the normal renal parenchyma. Parenchyma is isoechoic to the index organ, the spleen. No focal lesions are present. There is no stone or  mass lesion. No hydronephrosis is present. Bladder: Heterogeneous mass lesion is noted in the posterior wall of bladder. This may represent fibrotic clot or calcified mass. This appears separate from the prostate gland. Other: None. IMPRESSION: 1. Acute abnormality.  No acute obstruction. 2. Fall renal parenchyma is hyperechoic bilaterally. This is nonspecific, but can be seen in the setting of medical renal disease. 3. Hyperechoic mass lesion the posterior wall of the urinary bladder. This may represent focal bladder wall mass versus fibrotic clot. Similar finding was noted mid other hemorrhage on the recent CT scan. Follow-up CT of the pelvis with contrast or MRI the pelvis without and with contrast could be useful for further evaluation if clinically indicated. Remaining hemorrhage within the bladder appears to have cleared. Electronically Signed   By: San Morelle M.D.   On: 12/26/2018 07:37   DG Chest Portable 1 View  Result Date: 01/10/2019 CLINICAL DATA:  Hypoxia EXAM: PORTABLE CHEST 1 VIEW COMPARISON:  12/25/2018 FINDINGS: Heart and mediastinal contours are within normal limits. No focal opacities or effusions. No acute bony abnormality. IMPRESSION: No active disease. Electronically Signed   By: Rolm Baptise M.D.   On: 01/10/2019 20:01   DG Chest Portable 1 View  Result Date: 12/25/2018 CLINICAL DATA:  73 year old male with weakness. EXAM: PORTABLE CHEST 1 VIEW COMPARISON:  None. FINDINGS: The heart size and mediastinal contours are within normal limits. Both lungs are clear. The visualized skeletal structures are unremarkable. IMPRESSION: No active disease. Electronically Signed   By: Anner Crete M.D.   On: 12/25/2018 16:01    (Echo, Carotid, EGD, Colonoscopy, ERCP)    Subjective: Patient is awake alert anxious to go back to [place] no other new complaints no nausea vomiting chest pain shortness of breath or cough his only complaint is the food is cold.  Foley draining  clear urine Discharge Exam: Vitals:   01/12/19 0557 01/12/19 0838  BP: (!) 105/58 (!) 102/57  Pulse: 80 80  Resp: 18 16  Temp: 97.8 F (36.6 C) 98 F (36.7 C)  SpO2: 99% 97%   Vitals:   01/11/19 1700 01/11/19 2110 01/12/19 0557 01/12/19 0838  BP: 110/75 (!) 94/52 (!) 105/58 (!) 102/57  Pulse: 80 84 80 80  Resp: 18 18 18 16   Temp: 98.5 F (36.9 C) 98.1 F (36.7 C) 97.8 F (36.6 C) 98 F (36.7 C)  TempSrc: Oral Oral  Oral  SpO2: 99% 97% 99% 97%  Weight:      Height:        General: Pt is alert, awake, not in acute distress Cardiovascular: RRR, S1/S2 +, no rubs, no gallops Respiratory: CTA bilaterally, no wheezing, no rhonchi Abdominal: Soft, NT, ND, bowel sounds + Extremities: no edema, no cyanosis  The results of significant diagnostics from this hospitalization (including imaging, microbiology, ancillary and laboratory) are listed below for reference.     Microbiology: Recent Results (from the past 240 hour(s))  Respiratory Panel by RT PCR (Flu A&B, Covid) - Urine, Catheterized     Status: None   Collection Time: 01/10/19 10:15 PM   Specimen: Urine, Catheterized  Result Value Ref Range Status   SARS Coronavirus 2 by RT PCR NEGATIVE NEGATIVE Final    Comment: (NOTE) SARS-CoV-2 target nucleic acids are NOT DETECTED. The SARS-CoV-2 RNA is generally detectable in upper respiratoy specimens during the acute phase of infection. The lowest concentration of SARS-CoV-2 viral copies this assay can detect is 131 copies/mL. A negative result does not preclude SARS-Cov-2 infection and should not be used as the sole basis for treatment or other patient management decisions. A negative result may occur with  improper specimen collection/handling, submission of specimen other than nasopharyngeal swab, presence of viral mutation(s) within the areas targeted by this assay, and inadequate number of viral copies (<131 copies/mL). A negative result must be combined with  clinical observations, patient history, and epidemiological information. The expected result is Negative. Fact Sheet for Patients:  PinkCheek.be Fact Sheet for Healthcare Providers:  GravelBags.it This test is not yet ap proved or cleared by the [place] FDA and  has been authorized for detection and/or diagnosis of SARS-CoV-2 by FDA under an Emergency Use Authorization (EUA). This EUA will remain  in effect (meaning this test can be used) for the duration of the COVID-19 declaration under Section 564(b)(1) of the Act, 21 U.S.C. section 360bbb-3(b)(1), unless the authorization is terminated or revoked sooner.    Influenza A by PCR NEGATIVE NEGATIVE Final   Influenza B by PCR NEGATIVE NEGATIVE Final    Comment: (NOTE) The Xpert Xpress SARS-CoV-2/FLU/RSV assay is intended as an aid in  the diagnosis of influenza from Nasopharyngeal swab specimens and  should not be used as a sole basis for treatment. Nasal washings and  aspirates are unacceptable for Xpert Xpress SARS-CoV-2/FLU/RSV  testing. Fact Sheet for Patients: PinkCheek.be Fact Sheet for Healthcare Providers: GravelBags.it This test is not yet approved or cleared by the [place] FDA and  has been authorized for detection and/or diagnosis of SARS-CoV-2 by  FDA under an Emergency Use Authorization (EUA). This EUA will remain  in effect (meaning this test can be used) for the duration of the  Covid-19 declaration under Section 564(b)(1) of the Act, 21  U.S.C. section 360bbb-3(b)(1), unless the authorization is  terminated or revoked. Performed at [facility], [place] [address] [place],  [address]   Urine culture     Status: Abnormal (Preliminary result)   Collection Time: 01/10/19 10:15 PM   Specimen: Urine, Random  Result Value Ref Range Status   Specimen Description URINE, RANDOM  Final    Special Requests NONE  Final   Culture (A)  Final    60,000 COLONIES/mL PROTEUS VULGARIS CULTURE REINCUBATED FOR BETTER GROWTH Performed at [facility], [place] [address], [place], [place] [address]    Report Status PENDING  Incomplete   Organism ID, Bacteria PROTEUS VULGARIS (A)  Final      Susceptibility   Proteus vulgaris - MIC*    AMPICILLIN >=32 RESISTANT Resistant     CEFAZOLIN >=64 RESISTANT Resistant     CEFTRIAXONE <=1 SENSITIVE Sensitive     CIPROFLOXACIN <=0.25 SENSITIVE Sensitive     GENTAMICIN <=1 SENSITIVE Sensitive  IMIPENEM 1 SENSITIVE Sensitive     NITROFURANTOIN 128 RESISTANT Resistant     TRIMETH/SULFA <=20 SENSITIVE Sensitive     AMPICILLIN/SULBACTAM 16 INTERMEDIATE Intermediate     PIP/TAZO <=4 SENSITIVE Sensitive     * 60,000 COLONIES/mL PROTEUS VULGARIS  Culture, blood (routine x 2)     Status: None (Preliminary result)   Collection Time: 01/10/19 10:43 PM   Specimen: BLOOD  Result Value Ref Range Status   Specimen Description BLOOD RIGHT ARM  Final   Special Requests   Final    BOTTLES DRAWN AEROBIC AND ANAEROBIC Blood Culture results may not be optimal due to an excessive volume of blood received in culture bottles   Culture   Final    NO GROWTH < 24 HOURS Performed at [facility], [place] [address] [place], [place] [address]    Report Status PENDING  Incomplete  Culture, blood (routine x 2)     Status: None (Preliminary result)   Collection Time: 01/11/19  3:36 AM   Specimen: BLOOD LEFT ARM  Result Value Ref Range Status   Specimen Description BLOOD LEFT ARM  Final   Special Requests   Final    BOTTLES DRAWN AEROBIC ONLY Blood Culture adequate volume   Culture   Final    NO GROWTH < 12 HOURS Performed at [facility], [place] [address], [place], [place] [address]    Report Status PENDING  Incomplete  MRSA PCR Screening     Status: None   Collection Time: 01/11/19  5:42 AM   Specimen: Nasal Mucosa; Nasopharyngeal  Result Value  Ref Range Status   MRSA by PCR NEGATIVE NEGATIVE Final    Comment:        The GeneXpert MRSA Assay (FDA approved for NASAL specimens only), is one component of a comprehensive MRSA colonization surveillance program. It is not intended to diagnose MRSA infection nor to guide or monitor treatment for MRSA infections. Performed at [facility], [place] [address], [place],  [address]      Labs: BNP (last 3 results) Recent Labs    12/25/18 2304  BNP [id number]   Basic Metabolic Panel: Recent Labs  Lab 01/10/19 1809 01/10/19 2018 01/11/19 0336 01/12/19 0531  NA 134* 135 137 138  K 4.4 4.3 4.4 3.9  CL 101  --  105 106  CO2 17*  --  20* 24  GLUCOSE 129*  --  137* 148*  BUN 84*  --  75* 54*  CREATININE 3.97*  --  3.03* 1.91*  CALCIUM 8.3*  --  8.3* 8.4*   Liver Function Tests: No results for input(s): AST, ALT, ALKPHOS, BILITOT, PROT, ALBUMIN in the last 168 hours. No results for input(s): LIPASE, AMYLASE in the last 168 hours. No results for input(s): AMMONIA in the last 168 hours. CBC: Recent Labs  Lab 01/10/19 1809 01/10/19 2018 01/11/19 0336 01/12/19 0531  WBC 15.3*  --  10.4 8.1  NEUTROABS  --   --   --  5.3  HGB 10.7* 11.2* 9.7* 9.0*  HCT 34.2* 33.0* 31.3* 29.2*  MCV 91.4  --  91.8 90.4  PLT 335  --  273 249   Cardiac Enzymes: No results for input(s): CKTOTAL, CKMB, CKMBINDEX, TROPONINI in the last 168 hours. BNP: Invalid input(s): POCBNP CBG: Recent Labs  Lab 01/11/19 1135 01/11/19 1700 01/11/19 2109 01/12/19 0705 01/12/19 1122  GLUCAP 127* 156* 160* 118* 185*   D-Dimer No results for input(s): DDIMER in  the last 72 hours. Hgb A1c No results for input(s): HGBA1C in the last 72 hours. Lipid Profile No results for input(s): CHOL, HDL, LDLCALC, TRIG, CHOLHDL, LDLDIRECT in the last 72 hours. Thyroid function studies No results for input(s): TSH, T4TOTAL, T3FREE, THYROIDAB in the last 72 hours.  Invalid input(s): FREET3 Anemia work up No  results for input(s): VITAMINB12, FOLATE, FERRITIN, TIBC, IRON, RETICCTPCT in the last 72 hours. Urinalysis    Component Value Date/Time   COLORURINE YELLOW 01/10/2019 2215   APPEARANCEUR TURBID (A) 01/10/2019 2215   LABSPEC 1.012 01/10/2019 2215   PHURINE 6.0 01/10/2019 2215   GLUCOSEU NEGATIVE 01/10/2019 2215   HGBUR LARGE (A) 01/10/2019 2215   BILIRUBINUR NEGATIVE 01/10/2019 2215   KETONESUR 5 (A) 01/10/2019 2215   PROTEINUR 100 (A) 01/10/2019 2215   NITRITE NEGATIVE 01/10/2019 2215   LEUKOCYTESUR MODERATE (A) 01/10/2019 2215   Sepsis Labs Invalid input(s): PROCALCITONIN,  WBC,  LACTICIDVEN Microbiology Recent Results (from the past 240 hour(s))  Respiratory Panel by RT PCR (Flu A&B, Covid) - Urine, Catheterized     Status: None   Collection Time: 01/10/19 10:15 PM   Specimen: Urine, Catheterized  Result Value Ref Range Status   SARS Coronavirus 2 by RT PCR NEGATIVE NEGATIVE Final    Comment: (NOTE) SARS-CoV-2 target nucleic acids are NOT DETECTED. The SARS-CoV-2 RNA is generally detectable in upper respiratoy specimens during the acute phase of infection. The lowest concentration of SARS-CoV-2 viral copies this assay can detect is 131 copies/mL. A negative result does not preclude SARS-Cov-2 infection and should not be used as the sole basis for treatment or other patient management decisions. A negative result may occur with  improper specimen collection/handling, submission of specimen other than nasopharyngeal swab, presence of viral mutation(s) within the areas targeted by this assay, and inadequate number of viral copies (<131 copies/mL). A negative result must be combined with clinical observations, patient history, and epidemiological information. The expected result is Negative. Fact Sheet for Patients:  PinkCheek.be Fact Sheet for Healthcare Providers:  GravelBags.it This test is not yet ap proved or  cleared by the [place] FDA and  has been authorized for detection and/or diagnosis of SARS-CoV-2 by FDA under an Emergency Use Authorization (EUA). This EUA will remain  in effect (meaning this test can be used) for the duration of the COVID-19 declaration under Section 564(b)(1) of the Act, 21 U.S.C. section 360bbb-3(b)(1), unless the authorization is terminated or revoked sooner.    Influenza A by PCR NEGATIVE NEGATIVE Final   Influenza B by PCR NEGATIVE NEGATIVE Final    Comment: (NOTE) The Xpert Xpress SARS-CoV-2/FLU/RSV assay is intended as an aid in  the diagnosis of influenza from Nasopharyngeal swab specimens and  should not be used as a sole basis for treatment. Nasal washings and  aspirates are unacceptable for Xpert Xpress SARS-CoV-2/FLU/RSV  testing. Fact Sheet for Patients: PinkCheek.be Fact Sheet for Healthcare Providers: GravelBags.it This test is not yet approved or cleared by the [place] FDA and  has been authorized for detection and/or diagnosis of SARS-CoV-2 by  FDA under an Emergency Use Authorization (EUA). This EUA will remain  in effect (meaning this test can be used) for the duration of the  Covid-19 declaration under Section 564(b)(1) of the Act, 21  U.S.C. section 360bbb-3(b)(1), unless the authorization is  terminated or revoked. Performed at [facility], [place] [address] [place], [place] [address]   Urine culture     Status: Abnormal (Preliminary result)  Collection Time: 01/10/19 10:15 PM   Specimen: Urine, Random  Result Value Ref Range Status   Specimen Description URINE, RANDOM  Final   Special Requests NONE  Final   Culture (A)  Final    60,000 COLONIES/mL PROTEUS VULGARIS CULTURE REINCUBATED FOR BETTER GROWTH Performed at [facility], [address] [place], [place] [address]    Report Status PENDING  Incomplete   Organism ID, Bacteria PROTEUS VULGARIS (A)   Final      Susceptibility   Proteus vulgaris - MIC*    AMPICILLIN >=32 RESISTANT Resistant     CEFAZOLIN >=64 RESISTANT Resistant     CEFTRIAXONE <=1 SENSITIVE Sensitive     CIPROFLOXACIN <=0.25 SENSITIVE Sensitive     GENTAMICIN <=1 SENSITIVE Sensitive     IMIPENEM 1 SENSITIVE Sensitive     NITROFURANTOIN 128 RESISTANT Resistant     TRIMETH/SULFA <=20 SENSITIVE Sensitive     AMPICILLIN/SULBACTAM 16 INTERMEDIATE Intermediate     PIP/TAZO <=4 SENSITIVE Sensitive     * 60,000 COLONIES/mL PROTEUS VULGARIS  Culture, blood (routine x 2)     Status: None (Preliminary result)   Collection Time: 01/10/19 10:43 PM   Specimen: BLOOD  Result Value Ref Range Status   Specimen Description BLOOD RIGHT ARM  Final   Special Requests   Final    BOTTLES DRAWN AEROBIC AND ANAEROBIC Blood Culture results may not be optimal due to an excessive volume of blood received in culture bottles   Culture   Final    NO GROWTH < 24 HOURS Performed at [facility], [place] [address] [place], [place] [address]    Report Status PENDING  Incomplete  Culture, blood (routine x 2)     Status: None (Preliminary result)   Collection Time: 01/11/19  3:36 AM   Specimen: BLOOD LEFT ARM  Result Value Ref Range Status   Specimen Description BLOOD LEFT ARM  Final   Special Requests   Final    BOTTLES DRAWN AEROBIC ONLY Blood Culture adequate volume   Culture   Final    NO GROWTH < 12 HOURS Performed at [facility], [place] [address], [place], [place] [address]    Report Status PENDING  Incomplete  MRSA PCR Screening     Status: None   Collection Time: 01/11/19  5:42 AM   Specimen: Nasal Mucosa; Nasopharyngeal  Result Value Ref Range Status   MRSA by PCR NEGATIVE NEGATIVE Final    Comment:        The GeneXpert MRSA Assay (FDA approved for NASAL specimens only), is one component of a comprehensive MRSA colonization surveillance program. It is not intended to diagnose MRSA infection nor to guide or monitor  treatment for MRSA infections. Performed at [facility], [place] [address], [place],  [address]      Time coordinating discharge: 38 minutes  SIGNED:   Georgette Shell, MD  [facility] 01/12/2019, 12:21 PM Pager   If 7PM-7AM, please contact night-coverage [email or web address] Password [email or web address]

## 2019-01-13 LAB — URINE CULTURE: Culture: 60000 — AB

## 2019-01-15 LAB — CULTURE, BLOOD (ROUTINE X 2): Culture: NO GROWTH

## 2019-01-16 LAB — CULTURE, BLOOD (ROUTINE X 2)
Culture: NO GROWTH
Special Requests: ADEQUATE

## 2019-02-12 ENCOUNTER — Other Ambulatory Visit: Payer: Self-pay | Admitting: Urology

## 2019-02-13 ENCOUNTER — Encounter (HOSPITAL_BASED_OUTPATIENT_CLINIC_OR_DEPARTMENT_OTHER): Payer: Self-pay | Admitting: Urology

## 2019-02-14 ENCOUNTER — Other Ambulatory Visit: Payer: Self-pay

## 2019-02-14 ENCOUNTER — Encounter (HOSPITAL_BASED_OUTPATIENT_CLINIC_OR_DEPARTMENT_OTHER): Payer: Self-pay | Admitting: Urology
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
## 2019-02-15 NOTE — Progress Notes (Signed)
Left message with pam gibson to please call us with best contact number for [place].

## 2019-02-15 NOTE — Progress Notes (Signed)
Spoke with pam patient was instructed to arrive at 915 am 02-18-2019. Contact number is [phone] per pam gibson. Spoke with beverly taavon and patient is to arrive 800 am 02-18-2019 [facility].  Called [phone] and spoke with [facility], was transferred to director of nursing voicemail and left records request for medication administration record with time of day patient takes meds and most current history and physical note to be faxed to [phone] asap and made aware arrival time is now 800 am on 02-18-2019 at [facility] [place]. Fax for [employer] is [phone], attention tee.

## 2019-02-15 NOTE — Progress Notes (Addendum)
ADDENDUM:   Voicemail message left again for information to be faxed for this pt ASAP.   Have not received via fax information that was requested from [facility].  Called and lvm with [facility] [phone], requesting information to be faxed ASAP.

## 2019-02-18 ENCOUNTER — Other Ambulatory Visit: Payer: Self-pay

## 2019-02-18 ENCOUNTER — Encounter (HOSPITAL_BASED_OUTPATIENT_CLINIC_OR_DEPARTMENT_OTHER)
Admission: RE | Disposition: A | Discharge status: Skilled Nursing Facility | Payer: Self-pay | Source: Ambulatory Visit | Attending: Urology

## 2019-02-18 ENCOUNTER — Ambulatory Visit (HOSPITAL_BASED_OUTPATIENT_CLINIC_OR_DEPARTMENT_OTHER): Payer: Medicare Other | Admitting: Anesthesiology

## 2019-02-18 ENCOUNTER — Encounter (HOSPITAL_BASED_OUTPATIENT_CLINIC_OR_DEPARTMENT_OTHER): Payer: Self-pay | Admitting: Urology

## 2019-02-18 ENCOUNTER — Ambulatory Visit (HOSPITAL_BASED_OUTPATIENT_CLINIC_OR_DEPARTMENT_OTHER)
Admission: RE | Admit: 2019-02-18 | Discharge: 2019-02-18 | Disposition: A | Discharge status: Skilled Nursing Facility | Payer: Medicare Other | Source: Ambulatory Visit | Attending: Urology | Admitting: Urology

## 2019-02-18 DIAGNOSIS — C679 Malignant neoplasm of bladder, unspecified: Secondary | ICD-10-CM | POA: Diagnosis present

## 2019-02-18 DIAGNOSIS — Z5309 Procedure and treatment not carried out because of other contraindication: Secondary | ICD-10-CM | POA: Diagnosis not present

## 2019-02-18 DIAGNOSIS — Z20822 Contact with and (suspected) exposure to covid-19: Secondary | ICD-10-CM | POA: Insufficient documentation

## 2019-02-18 DIAGNOSIS — E1165 Type 2 diabetes mellitus with hyperglycemia: Secondary | ICD-10-CM | POA: Insufficient documentation

## 2019-02-18 DIAGNOSIS — E876 Hypokalemia: Secondary | ICD-10-CM | POA: Diagnosis not present

## 2019-02-18 HISTORY — DX: Polyneuropathy, unspecified: G62.9

## 2019-02-18 HISTORY — DX: Gastro-esophageal reflux disease without esophagitis: K21.9

## 2019-02-18 HISTORY — DX: Type 2 diabetes mellitus without complications: E11.9

## 2019-02-18 HISTORY — DX: Personal history of other (healed) physical injury and trauma: Z87.828

## 2019-02-18 HISTORY — DX: Malignant neoplasm of bladder, unspecified: C67.9

## 2019-02-18 HISTORY — DX: Chronic kidney disease, stage 3 unspecified: N18.30

## 2019-02-18 HISTORY — DX: Unspecified hearing loss, right ear: H91.91

## 2019-02-18 HISTORY — DX: Presence of other specified devices: Z97.8

## 2019-02-18 HISTORY — DX: Alcohol dependence, in remission: F10.21

## 2019-02-18 LAB — POCT I-STAT, CHEM 8
BUN: 6 mg/dL — ABNORMAL LOW (ref 8–23)
Calcium, Ion: 1.12 mmol/L — ABNORMAL LOW (ref 1.15–1.40)
Chloride: 99 mmol/L (ref 98–111)
Creatinine, Ser: 1.2 mg/dL (ref 0.61–1.24)
Glucose, Bld: 178 mg/dL — ABNORMAL HIGH (ref 70–99)
HCT: 35 % — ABNORMAL LOW (ref 39.0–52.0)
Hemoglobin: 11.9 g/dL — ABNORMAL LOW (ref 13.0–17.0)
Potassium: 2.7 mmol/L — CL (ref 3.5–5.1)
Sodium: 139 mmol/L (ref 135–145)
TCO2: 25 mmol/L (ref 22–32)

## 2019-02-18 LAB — RESPIRATORY PANEL BY RT PCR (FLU A&B, COVID)
Influenza A by PCR: NEGATIVE
Influenza B by PCR: NEGATIVE
SARS Coronavirus 2 by RT PCR: NEGATIVE

## 2019-02-18 LAB — POTASSIUM: Potassium: 2.8 mmol/L — ABNORMAL LOW (ref 3.5–5.1)

## 2019-02-18 SURGERY — TURBT (TRANSURETHRAL RESECTION OF BLADDER TUMOR)
Anesthesia: General

## 2019-02-18 MED ORDER — CEFAZOLIN SODIUM-DEXTROSE 2-4 GM/100ML-% IV SOLN
INTRAVENOUS | Status: AC
  Filled 2019-02-18: qty 100

## 2019-02-18 MED ORDER — CEFAZOLIN SODIUM-DEXTROSE 2-4 GM/100ML-% IV SOLN
2.0000 g | INTRAVENOUS | Status: DC
  Filled 2019-02-18: qty 100

## 2019-02-18 MED ORDER — SODIUM CHLORIDE 0.9 % IV SOLN
INTRAVENOUS | Status: DC
  Filled 2019-02-18: qty 1000

## 2019-02-18 SURGICAL SUPPLY — 23 items
BAG DRAIN URO-CYSTO SKYTR STRL (DRAIN) ×1 IMPLANT
BAG DRN RND TRDRP ANRFLXCHMBR (UROLOGICAL SUPPLIES)
BAG DRN UROCATH (DRAIN)
BAG URINE DRAIN 2000ML AR STRL (UROLOGICAL SUPPLIES) IMPLANT
BAG URINE LEG 500ML (DRAIN) IMPLANT
CATH FOLEY 2WAY SLVR  5CC 22FR (CATHETERS)
CATH FOLEY 2WAY SLVR 30CC 20FR (CATHETERS) IMPLANT
CATH FOLEY 2WAY SLVR 5CC 22FR (CATHETERS) IMPLANT
CLOTH BEACON ORANGE TIMEOUT ST (SAFETY) ×1 IMPLANT
ELECT REM PT RETURN 9FT ADLT (ELECTROSURGICAL)
ELECTRODE REM PT RTRN 9FT ADLT (ELECTROSURGICAL) ×1 IMPLANT
EVACUATOR MICROVAS BLADDER (UROLOGICAL SUPPLIES) IMPLANT
GLOVE BIO SURGEON STRL SZ7.5 (GLOVE) ×1 IMPLANT
GOWN STRL REUS W/ TWL LRG LVL3 (GOWN DISPOSABLE) ×1 IMPLANT
GOWN STRL REUS W/TWL LRG LVL3 (GOWN DISPOSABLE)
IV NS IRRIG 3000ML ARTHROMATIC (IV SOLUTION) ×1 IMPLANT
KIT TURNOVER CYSTO (KITS) ×1 IMPLANT
MANIFOLD NEPTUNE II (INSTRUMENTS) IMPLANT
PACK CYSTO (CUSTOM PROCEDURE TRAY) ×1 IMPLANT
SYR TOOMEY IRRIG 70ML (MISCELLANEOUS)
SYRINGE TOOMEY IRRIG 70ML (MISCELLANEOUS) IMPLANT
TUBE CONNECTING 12'X1/4 (SUCTIONS)
TUBE CONNECTING 12X1/4 (SUCTIONS) IMPLANT

## 2019-02-18 NOTE — Anesthesia Preprocedure Evaluation (Addendum)
Anesthesia Evaluation    Airway        Dental   Pulmonary former smoker (quit 1979),  02/18/2019 SARS coronavirus NEG          Cardiovascular hypertension, (-) angina     Neuro/Psych    GI/Hepatic GERD  Medicated and Controlled,  Endo/Other  diabetes (now diet controlled, glu 178)  Renal/GU Renal InsufficiencyRenal disease (K+ 2.8)     Musculoskeletal   Abdominal   Peds  Hematology   Anesthesia Other Findings   Reproductive/Obstetrics                            Anesthesia Physical Anesthesia Plan  ASA: III  Anesthesia Plan:    Post-op Pain Management:    Induction:   PONV Risk Score and Plan:   Airway Management Planned:   Additional Equipment:   Intra-op Plan:   Post-operative Plan:   Informed Consent:   Plan Discussed with:   Anesthesia Plan Comments: (Discussed hypokalemia K+ 2.7, repeat 2.8 with Dr. Gloriann Loan and patient  They agree, given hyperglycemia as well, to have medicine eval and correct prior to this elective surgery.)        Anesthesia Quick Evaluation

## 2019-03-26 ENCOUNTER — Other Ambulatory Visit: Payer: Self-pay | Admitting: Urology

## 2019-04-02 ENCOUNTER — Encounter (HOSPITAL_COMMUNITY): Payer: Self-pay | Admitting: Urology

## 2019-04-02 NOTE — Progress Notes (Signed)
Preop instructions for:  [name], [name].                        Date of Birth  1945/06/12                           Date of Procedure: 04/08/19      Doctor: Dr Link Snuffer  Time to arrive at [facility]:  0900am  Report to: Admitting  Procedure: Transurethral Resection of Bladder Tumor    Do not eat or drink past midnight the night before your procedure.(To include any tube feedings-must be discontinued)    Take these morning medications only with sips of water.(or give through gastrostomy or feeding tube). None      Facility contact:   [place] POA:  Transportation contact phone#:  Please send day of procedure:current med list and meds last taken that day, confirm nothing by mouth status from what time, Patient Demographic info( to include DNR status, problem list, allergies)   RN contact name/phone#:                             and Fax #:  [place] card and picture ID Leave all jewelry and other valuables at place where living( no metal or rings to be worn) No contact lens ,  Men-no colognes,lotions  Any questions day of procedure,call  SHORT STAY-[phone]   Sent from :[facility]                   [place]                   Fax:[phone]  Sent by :  Gillian Shields   RN

## 2019-04-02 NOTE — Progress Notes (Signed)
Received demographic sheet, history, and MAR from [place]. Placed on chart.

## 2019-04-02 NOTE — Progress Notes (Signed)
Preop instructions faxed to [facility] with fax confirmation.

## 2019-04-05 NOTE — Progress Notes (Signed)
Called and LVMM at [facility] for nurse regarding time change.  Also sent fax to Nursing Home with time change.

## 2019-04-08 ENCOUNTER — Telehealth (HOSPITAL_COMMUNITY): Payer: Self-pay | Admitting: *Deleted

## 2019-04-08 ENCOUNTER — Ambulatory Visit (HOSPITAL_COMMUNITY)
Admission: RE | Admit: 2019-04-08 | Discharge: 2019-04-08 | Disposition: A | Discharge status: Skilled Nursing Facility | Payer: Medicare Other | Source: Ambulatory Visit | Attending: Urology | Admitting: Urology

## 2019-04-08 ENCOUNTER — Ambulatory Visit (HOSPITAL_COMMUNITY): Payer: Medicare Other | Admitting: Physician Assistant

## 2019-04-08 ENCOUNTER — Encounter (HOSPITAL_COMMUNITY)
Admission: RE | Disposition: A | Discharge status: Skilled Nursing Facility | Payer: Self-pay | Source: Ambulatory Visit | Attending: Urology

## 2019-04-08 ENCOUNTER — Encounter (HOSPITAL_COMMUNITY): Payer: Self-pay | Admitting: Urology

## 2019-04-08 DIAGNOSIS — E1122 Type 2 diabetes mellitus with diabetic chronic kidney disease: Secondary | ICD-10-CM | POA: Diagnosis not present

## 2019-04-08 DIAGNOSIS — E1142 Type 2 diabetes mellitus with diabetic polyneuropathy: Secondary | ICD-10-CM | POA: Insufficient documentation

## 2019-04-08 DIAGNOSIS — C675 Malignant neoplasm of bladder neck: Secondary | ICD-10-CM | POA: Diagnosis not present

## 2019-04-08 DIAGNOSIS — I129 Hypertensive chronic kidney disease with stage 1 through stage 4 chronic kidney disease, or unspecified chronic kidney disease: Secondary | ICD-10-CM | POA: Insufficient documentation

## 2019-04-08 DIAGNOSIS — N183 Chronic kidney disease, stage 3 unspecified: Secondary | ICD-10-CM | POA: Diagnosis not present

## 2019-04-08 DIAGNOSIS — Z87891 Personal history of nicotine dependence: Secondary | ICD-10-CM | POA: Insufficient documentation

## 2019-04-08 DIAGNOSIS — Z20822 Contact with and (suspected) exposure to covid-19: Secondary | ICD-10-CM | POA: Insufficient documentation

## 2019-04-08 DIAGNOSIS — Z79899 Other long term (current) drug therapy: Secondary | ICD-10-CM | POA: Diagnosis not present

## 2019-04-08 DIAGNOSIS — C671 Malignant neoplasm of dome of bladder: Secondary | ICD-10-CM | POA: Insufficient documentation

## 2019-04-08 DIAGNOSIS — C679 Malignant neoplasm of bladder, unspecified: Secondary | ICD-10-CM | POA: Diagnosis present

## 2019-04-08 HISTORY — PX: TRANSURETHRAL RESECTION OF BLADDER TUMOR: SHX2575

## 2019-04-08 HISTORY — DX: Other instability, right wrist: M25.331

## 2019-04-08 HISTORY — DX: Anemia, unspecified: D64.9

## 2019-04-08 HISTORY — DX: Other abnormalities of gait and mobility: R26.89

## 2019-04-08 HISTORY — DX: Obstructive and reflux uropathy, unspecified: N13.9

## 2019-04-08 HISTORY — DX: Muscle weakness (generalized): M62.81

## 2019-04-08 HISTORY — DX: Adult failure to thrive: R62.7

## 2019-04-08 LAB — CBC
HCT: 31.4 % — ABNORMAL LOW (ref 39.0–52.0)
Hemoglobin: 9.7 g/dL — ABNORMAL LOW (ref 13.0–17.0)
MCH: 25 pg — ABNORMAL LOW (ref 26.0–34.0)
MCHC: 30.9 g/dL (ref 30.0–36.0)
MCV: 80.9 fL (ref 80.0–100.0)
Platelets: 283 10*3/uL (ref 150–400)
RBC: 3.88 MIL/uL — ABNORMAL LOW (ref 4.22–5.81)
RDW: 16.2 % — ABNORMAL HIGH (ref 11.5–15.5)
WBC: 9.4 10*3/uL (ref 4.0–10.5)
nRBC: 0 % (ref 0.0–0.2)

## 2019-04-08 LAB — RESPIRATORY PANEL BY RT PCR (FLU A&B, COVID)
Influenza A by PCR: NEGATIVE
Influenza B by PCR: NEGATIVE
SARS Coronavirus 2 by RT PCR: NEGATIVE

## 2019-04-08 LAB — BASIC METABOLIC PANEL
Anion gap: 11 (ref 5–15)
BUN: 24 mg/dL — ABNORMAL HIGH (ref 8–23)
CO2: 20 mmol/L — ABNORMAL LOW (ref 22–32)
Calcium: 9.2 mg/dL (ref 8.9–10.3)
Chloride: 107 mmol/L (ref 98–111)
Creatinine, Ser: 1.26 mg/dL — ABNORMAL HIGH (ref 0.61–1.24)
GFR calc Af Amer: >60 mL/min (ref >60)
GFR calc non Af Amer: 55 mL/min — ABNORMAL LOW (ref >60)
Glucose, Bld: 145 mg/dL — ABNORMAL HIGH (ref 70–99)
Potassium: 4 mmol/L (ref 3.5–5.1)
Sodium: 138 mmol/L (ref 135–145)

## 2019-04-08 LAB — HEMOGLOBIN A1C
Hgb A1c MFr Bld: 6.1 % — ABNORMAL HIGH (ref 4.8–5.6)
Mean Plasma Glucose: 128.37 mg/dL

## 2019-04-08 LAB — GLUCOSE, CAPILLARY: Glucose-Capillary: 126 mg/dL — ABNORMAL HIGH (ref 70–99)

## 2019-04-08 SURGERY — TURBT (TRANSURETHRAL RESECTION OF BLADDER TUMOR)
Anesthesia: General

## 2019-04-08 MED ORDER — LACTATED RINGERS IV SOLN: INTRAVENOUS | Status: DC

## 2019-04-08 MED ORDER — PROPOFOL 10 MG/ML IV BOLUS
INTRAVENOUS | Status: AC
  Filled 2019-04-08: qty 20

## 2019-04-08 MED ORDER — SODIUM CHLORIDE 0.9 % IR SOLN
Status: DC | PRN
  Administered 2019-04-08: 9000 mL

## 2019-04-08 MED ORDER — ROCURONIUM BROMIDE 10 MG/ML (PF) SYRINGE
PREFILLED_SYRINGE | INTRAVENOUS | Status: DC | PRN
  Administered 2019-04-08: 60 mg via INTRAVENOUS

## 2019-04-08 MED ORDER — SUGAMMADEX SODIUM 200 MG/2ML IV SOLN
INTRAVENOUS | Status: DC | PRN
  Administered 2019-04-08: 200 mg via INTRAVENOUS

## 2019-04-08 MED ORDER — PROPOFOL 10 MG/ML IV BOLUS
INTRAVENOUS | Status: DC | PRN
  Administered 2019-04-08: 120 mg via INTRAVENOUS

## 2019-04-08 MED ORDER — ROCURONIUM BROMIDE 10 MG/ML (PF) SYRINGE
PREFILLED_SYRINGE | INTRAVENOUS | Status: AC
  Filled 2019-04-08: qty 10

## 2019-04-08 MED ORDER — FENTANYL CITRATE (PF) 100 MCG/2ML IJ SOLN
INTRAMUSCULAR | Status: AC
  Filled 2019-04-08: qty 2

## 2019-04-08 MED ORDER — ONDANSETRON HCL 4 MG/2ML IJ SOLN
INTRAMUSCULAR | Status: AC
  Filled 2019-04-08: qty 2

## 2019-04-08 MED ORDER — OXYCODONE HCL 5 MG/5ML PO SOLN: 5.0000 mg | Freq: Once | ORAL | Status: DC | PRN

## 2019-04-08 MED ORDER — SULFAMETHOXAZOLE-TRIMETHOPRIM 800-160 MG PO TABS: 1.0000 | ORAL_TABLET | Freq: Two times a day (BID) | ORAL | 0 refills | Status: DC

## 2019-04-08 MED ORDER — CEFAZOLIN SODIUM-DEXTROSE 2-4 GM/100ML-% IV SOLN
2.0000 g | INTRAVENOUS | Status: AC
  Administered 2019-04-08: 2 g via INTRAVENOUS
  Filled 2019-04-08: qty 100

## 2019-04-08 MED ORDER — LIDOCAINE 2% (20 MG/ML) 5 ML SYRINGE
INTRAMUSCULAR | Status: DC | PRN
  Administered 2019-04-08: 50 mg via INTRAVENOUS

## 2019-04-08 MED ORDER — FENTANYL CITRATE (PF) 100 MCG/2ML IJ SOLN
INTRAMUSCULAR | Status: DC | PRN
  Administered 2019-04-08 (×2): 50 ug via INTRAVENOUS

## 2019-04-08 MED ORDER — SUCCINYLCHOLINE CHLORIDE 200 MG/10ML IV SOSY
PREFILLED_SYRINGE | INTRAVENOUS | Status: AC
  Filled 2019-04-08: qty 10

## 2019-04-08 MED ORDER — 0.9 % SODIUM CHLORIDE (POUR BTL) OPTIME
TOPICAL | Status: DC | PRN
  Administered 2019-04-08: 1000 mL

## 2019-04-08 MED ORDER — ONDANSETRON HCL 4 MG/2ML IJ SOLN: 4.0000 mg | Freq: Once | INTRAMUSCULAR | Status: DC | PRN

## 2019-04-08 MED ORDER — FENTANYL CITRATE (PF) 100 MCG/2ML IJ SOLN: 25.0000 ug | INTRAMUSCULAR | Status: DC | PRN

## 2019-04-08 MED ORDER — OXYCODONE HCL 5 MG PO TABS: 5.0000 mg | ORAL_TABLET | Freq: Once | ORAL | Status: DC | PRN

## 2019-04-08 MED ORDER — LIDOCAINE 2% (20 MG/ML) 5 ML SYRINGE
INTRAMUSCULAR | Status: AC
  Filled 2019-04-08: qty 5

## 2019-04-08 MED ORDER — DEXAMETHASONE SODIUM PHOSPHATE 10 MG/ML IJ SOLN
INTRAMUSCULAR | Status: AC
  Filled 2019-04-08: qty 1

## 2019-04-08 MED ORDER — DEXAMETHASONE SODIUM PHOSPHATE 10 MG/ML IJ SOLN
INTRAMUSCULAR | Status: DC | PRN
  Administered 2019-04-08: 4 mg via INTRAVENOUS

## 2019-04-08 MED ORDER — ONDANSETRON HCL 4 MG/2ML IJ SOLN
INTRAMUSCULAR | Status: DC | PRN
  Administered 2019-04-08: 4 mg via INTRAVENOUS

## 2019-04-08 SURGICAL SUPPLY — 21 items
BAG DRN RND TRDRP ANRFLXCHMBR (UROLOGICAL SUPPLIES)
BAG URINE DRAIN 2000ML AR STRL (UROLOGICAL SUPPLIES) IMPLANT
BAG URO CATCHER STRL LF (MISCELLANEOUS) ×2 IMPLANT
CATH FOLEY 2WAY SLVR  5CC 18FR (CATHETERS)
CATH FOLEY 2WAY SLVR  5CC 20FR (CATHETERS) ×2
CATH FOLEY 2WAY SLVR 5CC 18FR (CATHETERS) IMPLANT
CATH FOLEY 2WAY SLVR 5CC 20FR (CATHETERS) IMPLANT
ELECT REM PT RETURN 15FT ADLT (MISCELLANEOUS) ×1 IMPLANT
GLOVE BIO SURGEON STRL SZ7.5 (GLOVE) ×2 IMPLANT
GOWN STRL REUS W/TWL XL LVL3 (GOWN DISPOSABLE) ×2 IMPLANT
KIT TURNOVER KIT A (KITS) IMPLANT
LOOP CUT BIPOLAR 24F LRG (ELECTROSURGICAL) ×1 IMPLANT
MANIFOLD NEPTUNE II (INSTRUMENTS) ×2 IMPLANT
PACK CYSTO (CUSTOM PROCEDURE TRAY) ×2 IMPLANT
PENCIL SMOKE EVACUATOR (MISCELLANEOUS) IMPLANT
PLUG CATH AND CAP STER (CATHETERS) IMPLANT
SYR TOOMEY IRRIG 70ML (MISCELLANEOUS) ×2
SYRINGE TOOMEY IRRIG 70ML (MISCELLANEOUS) IMPLANT
TUBING CONNECTING 10 (TUBING) ×2 IMPLANT
TUBING UROLOGY SET (TUBING) ×2 IMPLANT
WATER STERILE IRR 500ML POUR (IV SOLUTION) ×1 IMPLANT
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
## 2019-04-08 NOTE — Discharge Instructions (Signed)

## 2019-04-08 NOTE — Op Note (Signed)
Operative Note  Preoperative diagnosis:  1.  Bladder cancer  Postoperative diagnosis: 1.  Bladder cancer with persistent tumor greater than 5 cm  Procedure(s): 1.  Transurethral resection of bladder tumor--large  Surgeon: Link Snuffer, MD  Assistants: None  Anesthesia: General  Complications: None immediate  EBL: Minimal  Specimens: 1.  Bladder tumor  Drains/Catheters: 1.  20 French Foley catheter  Intraoperative findings: 1.  Normal anterior urethra 2.  Borderline obstructing prostate 3.  Bladder mucosa with a large trigonal mass extending to the right that was papillary in nature and about 5 cm in size.  It was overlying the right ureteral orifice.  I was not able to locate the right ureteral orifice and had to resect over it but tried to use sparing electrocautery in the area that it should be.  The left ureteral orifice was normal and well away from the area of resection.  Had a separate papillary area towards the dome of the bladder that I resected that appeared more consistent with Foley catheter edema.  He had a few diverticuli.  However, these were free of tumor.  There was also an approximately 2 cm papillary bladder tumor surrounding the bladder neck anteriorly.  All tumor superficially appeared to be completely resected.  Indication: 74 year old male with a history of bladder cancer status post 2 resections presents for a repeat TURBT.  Description of procedure:  The patient was identified and consent was obtained.  The patient was taken to the operating room and placed in the supine position.  The patient was placed under general anesthesia.  Perioperative antibiotics were administered.  The patient was placed in dorsal lithotomy.  Patient was prepped and draped in a standard sterile fashion and a timeout was performed.  A 28 French resectoscope with a visual obturator in place was advanced into the urethra and into the bladder.  Complete cystoscopy was performed with  findings noted above.  I exchanged this for the working element.  On bipolar settings, I proceeded to resect all areas of bladder tumor and fulgurated the resection beds.  Bladder tumor specimens were collected.  I reinspected the bladder mucosa and there was no active bleeding noted.  I reinspected the entire bladder and saw no other bladder tumors.  I placed a 20 French Foley catheter.  This concluded the operation.  Patient tolerated procedure well and was stable postoperative.  Plan: Follow-up in about 1 week for voiding trial and pathology review

## 2019-04-08 NOTE — Progress Notes (Signed)
Nurse practioner from [facility] called and states patient has confirmed UTI.  Dr. Gloriann Loan was called at this time and he was informed of new information.  He states we should be able to proceed with surgery today.  No new orders at this time.

## 2019-04-08 NOTE — Anesthesia Procedure Notes (Signed)
Procedure Name: Intubation Date/Time: 04/08/2019 11:14 AM Performed by: Niel Hummer, CRNA Pre-anesthesia Checklist: Patient identified, Emergency Drugs available, Suction available and Patient being monitored Patient Re-evaluated:Patient Re-evaluated prior to induction Oxygen Delivery Method: Circle system utilized Preoxygenation: Pre-oxygenation with 100% oxygen Induction Type: IV induction Ventilation: Oral airway inserted - appropriate to patient size and Mask ventilation without difficulty Laryngoscope Size: Mac and 4 Grade View: Grade II Tube type: Oral Tube size: 7.5 mm Number of attempts: 1 Airway Equipment and Method: Stylet Placement Confirmation: ETT inserted through vocal cords under direct vision,  positive ETCO2 and breath sounds checked- equal and bilateral Secured at: 22 cm Tube secured with: Tape Dental Injury: Teeth and Oropharynx as per pre-operative assessment

## 2019-04-08 NOTE — Transfer of Care (Signed)
Immediate Anesthesia Transfer of Care Note  Patient: [name].  Procedure(s) Performed: TRANSURETHRAL RESECTION OF BLADDER TUMOR (TURBT) (N/A )  Patient Location: PACU  Anesthesia Type:General  Level of Consciousness: awake, alert  and oriented  Airway & Oxygen Therapy: Patient Spontanous Breathing and Patient connected to face mask oxygen  Post-op Assessment: Report given to RN, Post -op Vital signs reviewed and stable and Patient moving all extremities X 4  Post vital signs: Reviewed and stable  Last Vitals:  Vitals Value Taken Time  BP    Temp    Pulse 79 04/08/19 1205  Resp 17 04/08/19 1205  SpO2 100 % 04/08/19 1205  Vitals shown include unvalidated device data.  Last Pain:  Vitals:   04/08/19 0942  TempSrc:   PainSc: 0-No pain         Complications: No apparent anesthesia complications

## 2019-04-08 NOTE — Anesthesia Preprocedure Evaluation (Addendum)
Anesthesia Evaluation  Patient identified by MRN, date of birth, ID band Patient awake    Reviewed: Allergy & Precautions, NPO status , Patient's Chart, lab work & pertinent test results  History of Anesthesia Complications Negative for: history of anesthetic complications  Airway Mallampati: III  TM Distance: >3 FB Neck ROM: Full    Dental  (+) Dental Advisory Given, Poor Dentition, Missing   Pulmonary former smoker,    Pulmonary exam normal        Cardiovascular hypertension, Normal cardiovascular exam     Neuro/Psych  Hearing loss right side   Neuromuscular disease (neuropathy) negative psych ROS   GI/Hepatic GERD  Controlled,(+)     substance abuse  alcohol use,   Endo/Other  diabetes, Type 2  Renal/GU CRFRenal disease    Bladder cancer     Musculoskeletal negative musculoskeletal ROS (+)   Abdominal   Peds  Hematology negative hematology ROS (+)   Anesthesia Other Findings Covid neg 04/08/19    Reproductive/Obstetrics                           Anesthesia Physical Anesthesia Plan  ASA: III  Anesthesia Plan: General   Post-op Pain Management:    Induction: Intravenous  PONV Risk Score and Plan: 4 or greater and Treatment may vary due to age or medical condition, Ondansetron and Dexamethasone  Airway Management Planned: Oral ETT  Additional Equipment: None  Intra-op Plan:   Post-operative Plan: Extubation in OR  Informed Consent: I have reviewed the patients History and Physical, chart, labs and discussed the procedure including the risks, benefits and alternatives for the proposed anesthesia with the patient or authorized representative who has indicated his/her understanding and acceptance.   Patient has DNR.  Discussed DNR with patient and Suspend DNR.   Dental advisory given  Plan Discussed with: CRNA, Anesthesiologist and Surgeon  Anesthesia Plan  Comments:       Anesthesia Quick Evaluation

## 2019-04-08 NOTE — Anesthesia Postprocedure Evaluation (Signed)
Anesthesia Post Note  Patient: [name].  Procedure(s) Performed: TRANSURETHRAL RESECTION OF BLADDER TUMOR (TURBT) (N/A )     Patient location during evaluation: PACU Anesthesia Type: General Level of consciousness: awake and alert Pain management: pain level controlled Vital Signs Assessment: post-procedure vital signs reviewed and stable Respiratory status: spontaneous breathing, nonlabored ventilation and respiratory function stable Cardiovascular status: blood pressure returned to baseline and stable Postop Assessment: no apparent nausea or vomiting Anesthetic complications: no    Last Vitals:  Vitals:   04/08/19 1300 04/08/19 1330  BP: 119/65 131/76  Pulse: 74 75  Resp: 20 20  Temp: 36.9 C 36.9 C  SpO2: 95% 98%    Last Pain:  Vitals:   04/08/19 1330  TempSrc:   PainSc: 0-No pain                 Audry Pili

## 2019-04-09 LAB — SURGICAL PATHOLOGY

## 2019-04-22 ENCOUNTER — Other Ambulatory Visit: Payer: Self-pay | Admitting: Urology

## 2019-05-10 NOTE — Patient Instructions (Signed)
DUE TO COVID-19 ONLY ONE VISITOR IS ALLOWED TO COME WITH YOU AND STAY IN THE WAITING ROOM ONLY DURING PRE OP AND PROCEDURE DAY OF SURGERY. THE 1 VISITOR MAY VISIT WITH YOU AFTER SURGERY IN YOUR PRIVATE ROOM DURING VISITING HOURS ONLY!  YOU NEED TO HAVE A COVID 19 TEST ON__4/24_____ @__10 :30_____, THIS TEST MUST BE DONE BEFORE SURGERY, COME  [address], [place] , [address].  ([place]) ONCE YOUR COVID TEST IS COMPLETED, PLEASE BEGIN THE QUARANTINE INSTRUCTIONS AS OUTLINED IN YOUR HANDOUT.                [place].    Your procedure is scheduled on: 05/22/19   Report to [facility] Main  Entrance   Report to admitting at  10:10 AM     Call this number if you have problems the morning of surgery [phone]    Remember: Do not eat food or drink liquids :After Midnight.   BRUSH YOUR TEETH MORNING OF SURGERY AND RINSE YOUR MOUTH OUT, NO CHEWING GUM CANDY OR MINTS.     Take these medicines the morning of surgery with A SIP OF WATER: Citaloprm                                 You may not have any metal on your body including hair               piercings  Do not wear jewelry,  lotions, powders or  deodorant              Men may shave face and neck.   Do not bring valuables to the hospital. [place].  Contacts, dentures or bridgework may not be worn into surgery.      Patients discharged the day of surgery will not be allowed to drive home.   IF YOU ARE HAVING SURGERY AND GOING HOME THE SAME DAY, YOU MUST HAVE AN ADULT TO DRIVE YOU HOME AND BE WITH YOU FOR 24 HOURS.   YOU MAY GO HOME BY TAXI OR UBER OR ORTHERWISE, BUT AN ADULT MUST ACCOMPANY YOU HOME AND STAY WITH YOU FOR 24 HOURS.  Name and phone number of your driver:  Special Instructions: N/A              Please read over the following fact sheets you were  given: _____________________________________________________________________             [facility] - Preparing for Surgery Before surgery, you can play an important role.   Because skin is not sterile, your skin needs to be as free of germs as possible.   You can reduce the number of germs on your skin by washing with CHG (chlorahexidine gluconate) soap before surgery.   CHG is an antiseptic cleaner which kills germs and bonds with the skin to continue killing germs even after washing. Please DO NOT use if you have an allergy to CHG or antibacterial soaps .  If your skin becomes reddened/irritated stop using the CHG and inform your nurse when you arrive at Short Stay.   You may shave your face/neck.  Please follow these instructions carefully:  1.  Shower with CHG Soap the night before surgery and the  morning of Surgery.  2.  If you  choose to wash your hair, wash your hair first as usual with your  normal  shampoo.  3.  After you shampoo, rinse your hair and body thoroughly to remove the  shampoo.                                        4.  Use CHG as you would any other liquid soap.  You can apply chg directly  to the skin and wash                       Gently with a scrungie or clean washcloth.  5.  Apply the CHG Soap to your body ONLY FROM THE NECK DOWN.   Do not use on face/ open                           Wound or open sores. Avoid contact with eyes, ears mouth and genitals (private parts).                       Wash face,  Genitals (private parts) with your normal soap.             6.  Wash thoroughly, paying special attention to the area where your surgery  will be performed.  7.  Thoroughly rinse your body with warm water from the neck down.  8.  DO NOT shower/wash with your normal soap after using and rinsing off  the CHG Soap.             9.  Pat yourself dry with a clean towel.            10.  Wear clean pajamas.            11.  Place clean sheets on your bed the night of  your first shower and do not  sleep with pets. Day of Surgery : Do not apply any lotions/deodorants the morning of surgery.  Please wear clean clothes to the hospital/surgery center.  FAILURE TO FOLLOW THESE INSTRUCTIONS MAY RESULT IN THE CANCELLATION OF YOUR SURGERY PATIENT SIGNATURE_________________________________  NURSE SIGNATURE__________________________________  ________________________________________________________________________

## 2019-05-13 ENCOUNTER — Encounter (HOSPITAL_COMMUNITY): Payer: Self-pay

## 2019-05-13 ENCOUNTER — Other Ambulatory Visit: Payer: Self-pay

## 2019-05-13 ENCOUNTER — Encounter (HOSPITAL_COMMUNITY)
Admission: RE | Admit: 2019-05-13 | Discharge: 2019-05-13 | Disposition: A | Discharge status: Home / Self Care | Payer: Medicare Other | Source: Ambulatory Visit | Attending: Urology | Admitting: Urology

## 2019-05-13 DIAGNOSIS — Z01812 Encounter for preprocedural laboratory examination: Secondary | ICD-10-CM | POA: Diagnosis not present

## 2019-05-13 DIAGNOSIS — I10 Essential (primary) hypertension: Secondary | ICD-10-CM | POA: Diagnosis not present

## 2019-05-13 DIAGNOSIS — C678 Malignant neoplasm of overlapping sites of bladder: Secondary | ICD-10-CM | POA: Diagnosis not present

## 2019-05-13 DIAGNOSIS — Z79899 Other long term (current) drug therapy: Secondary | ICD-10-CM | POA: Insufficient documentation

## 2019-05-13 NOTE — Progress Notes (Signed)
PCP - Dr. Doreene Nest Cardiologist - none  Chest x-ray - 01/10/19 EKG - 12/26/18 Stress Test - no ECHO - no Cardiac Cath - no  Sleep Study - no CPAP - no  Fasting Blood Sugar - Pt doesn't test. He took Metformin years ago but the Dr. Cristi Loron it because his A1cs were WNL. I told him that he should follow up with his Dr. Because his A1cs were climbing up. Checks Blood Sugar _____ times a day  Blood Thinner Instructions:NA Aspirin Instructions: Last Dose:  Anesthesia review:   Patient denies shortness of breath, fever, cough and chest pain at PAT appointment yes  Patient verbalized understanding of instructions that were given to them at the PAT appointment. Patient was also instructed that they will need to review over the PAT instructions again at home before surgery. Yes Pt has a car but has lost the key and needs to get a new one made. He is not able to make the Lab visit or pick up soap and instructions. I asked him to take notes but he said he didn't need to. I read his instructions to him and repeated the Covid test and time as well as the surgery arrival time. I told him to take his Citalopram. I told him no food or drink after midnight. He said he understood the instructions.

## 2019-05-14 ENCOUNTER — Encounter (HOSPITAL_COMMUNITY): Admission: RE | Admit: 2019-05-14 | Payer: Medicare Other | Source: Ambulatory Visit

## 2019-05-18 ENCOUNTER — Other Ambulatory Visit (HOSPITAL_COMMUNITY)
Admission: RE | Admit: 2019-05-18 | Discharge: 2019-05-18 | Disposition: A | Discharge status: Home / Self Care | Payer: Medicare Other | Source: Ambulatory Visit | Attending: Urology | Admitting: Urology

## 2019-05-18 DIAGNOSIS — Z01812 Encounter for preprocedural laboratory examination: Secondary | ICD-10-CM | POA: Diagnosis present

## 2019-05-18 DIAGNOSIS — Z20822 Contact with and (suspected) exposure to covid-19: Secondary | ICD-10-CM | POA: Diagnosis not present

## 2019-05-18 LAB — SARS CORONAVIRUS 2 (TAT 6-24 HRS): SARS Coronavirus 2: NEGATIVE

## 2019-05-22 ENCOUNTER — Ambulatory Visit (HOSPITAL_COMMUNITY): Payer: Medicare Other | Admitting: Physician Assistant

## 2019-05-22 ENCOUNTER — Encounter (HOSPITAL_COMMUNITY)
Admission: RE | Disposition: A | Discharge status: Home / Self Care | Payer: Self-pay | Source: Home / Self Care | Attending: Urology

## 2019-05-22 ENCOUNTER — Ambulatory Visit (HOSPITAL_COMMUNITY): Payer: Medicare Other | Admitting: Certified Registered Nurse Anesthetist

## 2019-05-22 ENCOUNTER — Ambulatory Visit (HOSPITAL_COMMUNITY)
Admission: RE | Admit: 2019-05-22 | Discharge: 2019-05-22 | Disposition: A | Discharge status: Home / Self Care | Payer: Medicare Other | Attending: Urology | Admitting: Urology

## 2019-05-22 ENCOUNTER — Encounter (HOSPITAL_COMMUNITY): Payer: Self-pay | Admitting: Urology

## 2019-05-22 DIAGNOSIS — J45909 Unspecified asthma, uncomplicated: Secondary | ICD-10-CM | POA: Insufficient documentation

## 2019-05-22 DIAGNOSIS — C678 Malignant neoplasm of overlapping sites of bladder: Secondary | ICD-10-CM | POA: Insufficient documentation

## 2019-05-22 DIAGNOSIS — E119 Type 2 diabetes mellitus without complications: Secondary | ICD-10-CM | POA: Insufficient documentation

## 2019-05-22 DIAGNOSIS — F329 Major depressive disorder, single episode, unspecified: Secondary | ICD-10-CM | POA: Insufficient documentation

## 2019-05-22 DIAGNOSIS — Z87891 Personal history of nicotine dependence: Secondary | ICD-10-CM | POA: Diagnosis not present

## 2019-05-22 DIAGNOSIS — Z79899 Other long term (current) drug therapy: Secondary | ICD-10-CM | POA: Diagnosis not present

## 2019-05-22 DIAGNOSIS — I10 Essential (primary) hypertension: Secondary | ICD-10-CM | POA: Diagnosis not present

## 2019-05-22 DIAGNOSIS — Z7984 Long term (current) use of oral hypoglycemic drugs: Secondary | ICD-10-CM | POA: Diagnosis not present

## 2019-05-22 DIAGNOSIS — C679 Malignant neoplasm of bladder, unspecified: Secondary | ICD-10-CM | POA: Diagnosis present

## 2019-05-22 HISTORY — PX: TRANSURETHRAL RESECTION OF BLADDER TUMOR: SHX2575

## 2019-05-22 LAB — CBC
HCT: 40.3 % (ref 39.0–52.0)
Hemoglobin: 12.3 g/dL — ABNORMAL LOW (ref 13.0–17.0)
MCH: 25.3 pg — ABNORMAL LOW (ref 26.0–34.0)
MCHC: 30.5 g/dL (ref 30.0–36.0)
MCV: 82.8 fL (ref 80.0–100.0)
Platelets: 320 10*3/uL (ref 150–400)
RBC: 4.87 MIL/uL (ref 4.22–5.81)
RDW: 17.7 % — ABNORMAL HIGH (ref 11.5–15.5)
WBC: 9.6 10*3/uL (ref 4.0–10.5)
nRBC: 0 % (ref 0.0–0.2)

## 2019-05-22 LAB — BASIC METABOLIC PANEL
Anion gap: 10 (ref 5–15)
BUN: 20 mg/dL (ref 8–23)
CO2: 22 mmol/L (ref 22–32)
Calcium: 9.4 mg/dL (ref 8.9–10.3)
Chloride: 106 mmol/L (ref 98–111)
Creatinine, Ser: 1.36 mg/dL — ABNORMAL HIGH (ref 0.61–1.24)
GFR calc Af Amer: 59 mL/min — ABNORMAL LOW (ref >60)
GFR calc non Af Amer: 51 mL/min — ABNORMAL LOW (ref >60)
Glucose, Bld: 137 mg/dL — ABNORMAL HIGH (ref 70–99)
Potassium: 4 mmol/L (ref 3.5–5.1)
Sodium: 138 mmol/L (ref 135–145)

## 2019-05-22 LAB — GLUCOSE, CAPILLARY
Glucose-Capillary: 119 mg/dL — ABNORMAL HIGH (ref 70–99)
Glucose-Capillary: 117 mg/dL — ABNORMAL HIGH (ref 70–99)

## 2019-05-22 SURGERY — TURBT (TRANSURETHRAL RESECTION OF BLADDER TUMOR)
Anesthesia: General

## 2019-05-22 MED ORDER — ACETAMINOPHEN 500 MG PO TABS
1000.0000 mg | ORAL_TABLET | Freq: Once | ORAL | Status: AC
  Administered 2019-05-22: 1000 mg via ORAL
  Filled 2019-05-22: qty 2

## 2019-05-22 MED ORDER — 0.9 % SODIUM CHLORIDE (POUR BTL) OPTIME
TOPICAL | Status: DC | PRN
  Administered 2019-05-22: 1000 mL

## 2019-05-22 MED ORDER — PROPOFOL 10 MG/ML IV BOLUS
INTRAVENOUS | Status: DC | PRN
  Administered 2019-05-22: 100 mg via INTRAVENOUS

## 2019-05-22 MED ORDER — ROCURONIUM BROMIDE 50 MG/5ML IV SOSY
PREFILLED_SYRINGE | INTRAVENOUS | Status: DC | PRN
  Administered 2019-05-22: 70 mg via INTRAVENOUS

## 2019-05-22 MED ORDER — SODIUM CHLORIDE 0.9 % IR SOLN
Status: DC | PRN
  Administered 2019-05-22: 6000 mL

## 2019-05-22 MED ORDER — LIDOCAINE 2% (20 MG/ML) 5 ML SYRINGE
INTRAMUSCULAR | Status: DC | PRN
  Administered 2019-05-22: 60 mg via INTRAVENOUS

## 2019-05-22 MED ORDER — FENTANYL CITRATE (PF) 100 MCG/2ML IJ SOLN: 25.0000 ug | INTRAMUSCULAR | Status: DC | PRN

## 2019-05-22 MED ORDER — DEXAMETHASONE SODIUM PHOSPHATE 10 MG/ML IJ SOLN
INTRAMUSCULAR | Status: AC
  Filled 2019-05-22: qty 1

## 2019-05-22 MED ORDER — ONDANSETRON HCL 4 MG/2ML IJ SOLN
INTRAMUSCULAR | Status: AC
  Filled 2019-05-22: qty 2

## 2019-05-22 MED ORDER — HYDROCODONE-ACETAMINOPHEN 5-325 MG PO TABS: 1.0000 | ORAL_TABLET | ORAL | 0 refills | Status: DC | PRN

## 2019-05-22 MED ORDER — LACTATED RINGERS IV SOLN: INTRAVENOUS | Status: DC

## 2019-05-22 MED ORDER — CEFAZOLIN SODIUM-DEXTROSE 2-4 GM/100ML-% IV SOLN
2.0000 g | INTRAVENOUS | Status: AC
  Administered 2019-05-22: 2 g via INTRAVENOUS
  Filled 2019-05-22: qty 100

## 2019-05-22 MED ORDER — FENTANYL CITRATE (PF) 100 MCG/2ML IJ SOLN
INTRAMUSCULAR | Status: AC
  Filled 2019-05-22: qty 2

## 2019-05-22 MED ORDER — PROPOFOL 10 MG/ML IV BOLUS
INTRAVENOUS | Status: AC
  Filled 2019-05-22: qty 20

## 2019-05-22 MED ORDER — DEXAMETHASONE SODIUM PHOSPHATE 10 MG/ML IJ SOLN
INTRAMUSCULAR | Status: DC | PRN
  Administered 2019-05-22: 8 mg via INTRAVENOUS

## 2019-05-22 MED ORDER — LACTATED RINGERS IV SOLN
INTRAVENOUS | Status: DC | PRN
Start: 2019-05-22 — End: 2019-05-22

## 2019-05-22 MED ORDER — FENTANYL CITRATE (PF) 100 MCG/2ML IJ SOLN
INTRAMUSCULAR | Status: DC | PRN
  Administered 2019-05-22 (×2): 50 ug via INTRAVENOUS

## 2019-05-22 MED ORDER — ONDANSETRON HCL 4 MG/2ML IJ SOLN
INTRAMUSCULAR | Status: DC | PRN
  Administered 2019-05-22: 4 mg via INTRAVENOUS

## 2019-05-22 MED ORDER — SUGAMMADEX SODIUM 500 MG/5ML IV SOLN
INTRAVENOUS | Status: DC | PRN
  Administered 2019-05-22: 300 mg via INTRAVENOUS

## 2019-05-22 SURGICAL SUPPLY — 19 items
BAG DRN RND TRDRP ANRFLXCHMBR (UROLOGICAL SUPPLIES)
BAG URINE DRAIN 2000ML AR STRL (UROLOGICAL SUPPLIES) IMPLANT
BAG URO CATCHER STRL LF (MISCELLANEOUS) ×3 IMPLANT
CATH FOLEY 2WAY SLVR  5CC 18FR (CATHETERS)
CATH FOLEY 2WAY SLVR 5CC 18FR (CATHETERS) IMPLANT
ELECT REM PT RETURN 15FT ADLT (MISCELLANEOUS) ×1 IMPLANT
GLOVE BIO SURGEON STRL SZ7.5 (GLOVE) ×3 IMPLANT
GOWN STRL REUS W/TWL XL LVL3 (GOWN DISPOSABLE) ×3 IMPLANT
KIT TURNOVER KIT A (KITS) IMPLANT
LOOP CUT BIPOLAR 24F LRG (ELECTROSURGICAL) ×2 IMPLANT
MANIFOLD NEPTUNE II (INSTRUMENTS) ×3 IMPLANT
PENCIL SMOKE EVACUATOR (MISCELLANEOUS) IMPLANT
PLUG CATH AND CAP STER (CATHETERS) IMPLANT
SYR TOOMEY IRRIG 70ML (MISCELLANEOUS) ×3
SYRINGE TOOMEY IRRIG 70ML (MISCELLANEOUS) IMPLANT
TRAY CYSTO PACK (CUSTOM PROCEDURE TRAY) ×3 IMPLANT
TUBING CONNECTING 10 (TUBING) ×2 IMPLANT
TUBING CONNECTING 10' (TUBING) ×1
TUBING UROLOGY SET (TUBING) ×3 IMPLANT

## 2019-05-22 NOTE — H&P (Signed)
CC/HPI: CC: Bladder cancer  HPI:  11/26/2018  74 year old male 1st noted gross hematuria about 2 years ago. This resolved. He had another episode about 1 year ago that also resolved after several episodes. Most recently, he started having gross hematuria about 1 week ago. He went to the emergency department on 11/20/2018. He was found to have a large amount of heterogeneous blood products within the bladder with heterogeneous soft tissue mass/thickening along the posterior bladder wall. He continues to have gross hematuria. He passes some clots. He is able to void. On CT scan, the prostate gland was heterogeneous and abutted the posterior surface of the bladder. He had no hydronephrosis. No pelvic lymphadenopathy. Creatinine was 1.15. Hemoglobin was 16.2.   01/04/2019  Patient in the interval presented to the hospital with failure to thrive. He was admitted to the hospital with urinary tract infection. He was also having gross hematuria. He had already been scheduled for TURBT in so we proceed with that. 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 There was no detrusor present. When I looked in his bladder, there was severe inflammation. It was difficult to discern normal tissue.   04/17/2019  Patient presents after undergoing his 3rd TURBT. Tumor appeared to be completely resected. 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 Muscularis was present and uninvolved.   04/19/19: Pt with above PMHX. He presents for a 2nd trial of void after his 3rd TURBT. He says he is doing ok and ready to get this all over with. He does not think he has any blood in his catheter bag.     ALLERGIES: No Allergies    MEDICATIONS: Lisinopril 10 mg tablet  MetFORMIN HCl - 1000 MG Oral Tablet Oral     GU PSH: Cystoscopy - 11/26/2018 Cystoscopy TURBT >5 cm - 04/08/2019, 12/07/2018 Cystoscopy TURBT 2-5 cm - 12/28/2018       PSH Notes: Cholecystectomy,  Skull Repair   NON-GU PSH: Cholecystectomy (open) - 2013     GU PMH: Bladder Cancer overlapping sites - 04/17/2019 Bladder tumor/neoplasm - 11/26/2018 Gross hematuria - 11/26/2018 BPH w/LUTS, Benign localized prostatic hyperplasia with lower urinary tract symptoms (LUTS) - 2014 Elevated PSA, Elevated prostate specific antigen (PSA) - 2014    NON-GU PMH: Asthma, Asthma - 2014 Personal history of other diseases of the circulatory system, History of hypertension - 2014 Personal history of other endocrine, nutritional and metabolic disease, History of hypercholesterolemia - 2014, History of diabetes mellitus, - 2014 Personal history of other mental and behavioral disorders, History of depression - 2014 Personal history of other specified conditions, History of heartburn - 2014 Depression Encounter for general adult medical examination without abnormal findings, Encounter for preventive health examination GERD Hypertension    FAMILY HISTORY: 1 Daughter - Other Family Health Status Number - Runs In Family Father Deceased At Age23 ___ - Runs In Family Mother Deceased At Age 47 from diabetic complicati - Runs In Family stroke - Mother   SOCIAL HISTORY: Marital Status: Divorced Preferred Language: English; Race: White Current Smoking Status: Patient does not smoke anymore. Has not smoked since 11/24/1977.   Tobacco Use Assessment Completed: Used Tobacco in last 30 days? Does not drink caffeine.     Notes: Tobacco use, Occupation:, Marital History - Divorced, Caffeine Use, Alcohol Use   REVIEW OF SYSTEMS:    GU Review Male:   Patient denies frequent urination, hard to postpone urination, burning/ pain with urination, get up at night to urinate,  leakage of urine, stream starts and stops, trouble starting your stream, have to strain to urinate , erection problems, and penile pain.  Gastrointestinal (Upper):   Patient denies vomiting, indigestion/ heartburn, and nausea.  Gastrointestinal  (Lower):   Patient denies diarrhea and constipation.  Constitutional:   Patient denies fever, night sweats, weight loss, and fatigue.  Skin:   Patient reports itching. Patient denies skin rash/ lesion.  Eyes:   Patient denies blurred vision and double vision.  Ears/ Nose/ Throat:   Patient denies sore throat and sinus problems.  Hematologic/Lymphatic:   Patient denies swollen glands and easy bruising.  Cardiovascular:   Patient denies leg swelling and chest pains.  Respiratory:   Patient denies cough and shortness of breath.  Endocrine:   Patient denies excessive thirst.  Musculoskeletal:   Patient denies back pain and joint pain.  Neurological:   Patient denies headaches and dizziness.  Psychologic:   Patient denies depression and anxiety.   VITAL SIGNS:      04/19/2019 09:31 AM  Weight 208 lb / 94.35 kg  Height 73 in / 185.42 cm  BP 120/74 mmHg  Heart Rate 72 /min  Temperature 97.1 F / 36.1 C  BMI 27.4 kg/m   GU PHYSICAL EXAMINATION:    Penis: Foley draining clear yellow urine   MULTI-SYSTEM PHYSICAL EXAMINATION:    Constitutional: Well-nourished. No physical deformities. Normally developed. Good grooming.  Respiratory: No labored breathing, no use of accessory muscles.   Cardiovascular: Normal temperature, adequate perfusion of extremities  Skin: No paleness, no jaundice  Neurologic / Psychiatric: Oriented to time, oriented to place, oriented to person. No depression, no anxiety, no agitation.  Gastrointestinal: No mass, no tenderness, no rigidity, obese abdomen.   Eyes: Normal conjunctivae. Normal eyelids.  Musculoskeletal: Normal gait and station of head and neck.     PAST DATA REVIEWED:  Source Of History:  Patient, Medical Record Summary  Records Review:   Previous Doctor Records, Previous Hospital Records, Previous Patient Records  Urine Test Review:   Urinalysis  Urodynamics Review:   Review Bladder Scan   PROCEDURES:         Voiding Trial - 51700  Voided Volume:  80 cc  Scanned Volume: 144 cc  Instilled Volume: 180 cc        Notes:   Pt voided 72ml and a few minutes later voided another 94ml, 44ml in total.   ASSESSMENT:      ICD-10 Details  1 GU:   Bladder Cancer overlapping sites - C67.8 Chronic, Stable   PLAN:           Schedule Return Visit/Planned Activity: 1-2 Weeks - Follow up MD, Office Visit          Document Letter(s):  Created for Patient: Clinical Summary         Notes:   PT underwent a voiding and was able to void successfully. He was instructed to increase his water intake and continue to try to void every 2-3 hours. If unable or if he becomes uncomfortable he should return to the clinic by 3:30 today, otherwise over the weekend he willneed to proceed to the ED. HE was understanding. Return precautions discussed. He will follow up in the next few weeks for a PVR.         Next Appointment:      Next Appointment: 05/01/2019 04:00 PM    Appointment Type: Office Visit Established Patient    Location: [facility] [phone]  Provider: Link Snuffer, Shirley Friar, M.D.    Reason for Visit: 1-2 WKS OV      Signed by Daine Gravel, NP on 04/19/19 at 6:54 PM (EDT

## 2019-05-22 NOTE — Op Note (Signed)
Operative Note  Preoperative diagnosis:  1.  History of bladder cancer, bladder tumor  Postoperative diagnosis: 1.  History of bladder cancer, bladder tumor--medium  Procedure(s): 1.  Transurethral resection of bladder tumor--medium  Surgeon: Link Snuffer, MD  Assistants: None  Anesthesia: General  Complications: None immediate  EBL: Minimal  Specimens: 1.  Bladder tumor  Drains/Catheters: 1.  None  Intraoperative findings: 1.  Normal anterior urethra 2.  Borderline obstructing prostate 3.  Bilateral ureteral orifices were uninvolved and resection were normal. 4.  Moderate trabeculation in the bladder.  There was evidence of prior TUR in multiple areas.  On the anterior bladder wall was an approximately 2 cm papillary bladder tumor that was completely resected superficially.  No other tumors.  Indication: 74 year old male with history of bladder cancer presents for restage TURBT  Description of procedure:  The patient was identified and consent was obtained.  The patient was taken to the operating room and placed in the supine position.  The patient was placed under general anesthesia.  Perioperative antibiotics were administered.  The patient was placed in dorsal lithotomy.  Patient was prepped and draped in a standard sterile fashion and a timeout was performed.  A 26 French resectoscope with a visual obturator in place was advanced into the urethra and into the bladder.  Complete cystoscopy was performed with the findings noted above.  I exchanged for the bipolar working element and on bipolar settings proceeded to resect the area of interest.  Also took a couple of swipes on the posterior bladder wall near the area of resection previously.  Biopsy beds and resection beds were fulgurated.  There was no active bleeding.  I collected the specimens for permanent specimen.  I reinspected the bladder and there were no other tumors seen.  I therefore drained the bladder and withdrew  the scope.  Patient tolerated the procedure well was stable postoperatively.  Plan: Return in 1 week for pathology review.

## 2019-05-22 NOTE — Discharge Instructions (Addendum)
Transurethral Resection of Bladder Tumor (TURBT) or Bladder Biopsy ° ° °Definition: ° Transurethral Resection of the Bladder Tumor is a surgical procedure used to diagnose and remove tumors within the bladder. TURBT is the most common treatment for early stage bladder cancer. ° °General instructions: °   ° Your recent bladder surgery requires very little post hospital care but some definite precautions. ° °Despite the fact that no skin incisions were used, the area around the bladder incisions are raw and covered with scabs to promote healing and prevent bleeding. Certain precautions are needed to insure that the scabs are not disturbed over the next 2-4 weeks while the healing proceeds. ° °Because the raw surface inside your bladder and the irritating effects of urine you may expect frequency of urination and/or urgency (a stronger desire to urinate) and perhaps even getting up at night more often. This will usually resolve or improve slowly over the healing period. You may see some blood in your urine over the first 6 weeks. Do not be alarmed, even if the urine was clear for a while. Get off your feet and drink lots of fluids until clearing occurs. If you start to pass clots or don't improve call us. ° °Diet: ° °You may return to your normal diet immediately. Because of the raw surface of your bladder, alcohol, spicy foods, foods high in acid and drinks with caffeine may cause irritation or frequency and should be used in moderation. To keep your urine flowing freely and avoid constipation, drink plenty of fluids during the day (8-10 glasses). Tip: Avoid cranberry juice because it is very acidic. ° °Activity: ° °Your physical activity doesn't need to be restricted. However, if you are very active, you may see some blood in the urine. We suggest that you reduce your activity under the circumstances until the bleeding has stopped. ° °Bowels: ° °It is important to keep your bowels regular during the postoperative  period. Straining with bowel movements can cause bleeding. A bowel movement every other day is reasonable. Use a mild laxative if needed, such as milk of magnesia 2-3 tablespoons, or 2 Dulcolax tablets. Call if you continue to have problems. If you had been taking narcotics for pain, before, during or after your surgery, you may be constipated. Take a laxative if necessary. ° ° ° °Medication: ° °You should resume your pre-surgery medications unless told not to. In addition you may be given an antibiotic to prevent or treat infection. Antibiotics are not always necessary. All medication should be taken as prescribed until the bottles are finished unless you are having an unusual reaction to one of the drugs. ° ° ° °General Anesthesia, Adult, Care After °This sheet gives you information about how to care for yourself after your procedure. Your health care provider may also give you more specific instructions. If you have problems or questions, contact your health care provider. °What can I expect after the procedure? °After the procedure, the following side effects are common: °· Pain or discomfort at the IV site. °· Nausea. °· Vomiting. °· Sore throat. °· Trouble concentrating. °· Feeling cold or chills. °· Weak or tired. °· Sleepiness and fatigue. °· Soreness and body aches. These side effects can affect parts of the body that were not involved in surgery. °Follow these instructions at home: ° °For at least 24 hours after the procedure: °· Have a responsible adult stay with you. It is important to have someone help care for you until you are awake and   alert. °· Rest as needed. °· Do not: °? Participate in activities in which you could fall or become injured. °? Drive. °? Use heavy machinery. °? Drink alcohol. °? Take sleeping pills or medicines that cause drowsiness. °? Make important decisions or sign legal documents. °? Take care of children on your own. °Eating and drinking °· Follow any instructions from your  health care provider about eating or drinking restrictions. °· When you feel hungry, start by eating small amounts of foods that are soft and easy to digest (bland), such as toast. Gradually return to your regular diet. °· Drink enough fluid to keep your urine pale yellow. °· If you vomit, rehydrate by drinking water, juice, or clear broth. °General instructions °· If you have sleep apnea, surgery and certain medicines can increase your risk for breathing problems. Follow instructions from your health care provider about wearing your sleep device: °? Anytime you are sleeping, including during daytime naps. °? While taking prescription pain medicines, sleeping medicines, or medicines that make you drowsy. °· Return to your normal activities as told by your health care provider. Ask your health care provider what activities are safe for you. °· Take over-the-counter and prescription medicines only as told by your health care provider. °· If you smoke, do not smoke without supervision. °· Keep all follow-up visits as told by your health care provider. This is important. °Contact a health care provider if: °· You have nausea or vomiting that does not get better with medicine. °· You cannot eat or drink without vomiting. °· You have pain that does not get better with medicine. °· You are unable to pass urine. °· You develop a skin rash. °· You have a fever. °· You have redness around your IV site that gets worse. °Get help right away if: °· You have difficulty breathing. °· You have chest pain. °· You have blood in your urine or stool, or you vomit blood. °Summary °· After the procedure, it is common to have a sore throat or nausea. It is also common to feel tired. °· Have a responsible adult stay with you for the first 24 hours after general anesthesia. It is important to have someone help care for you until you are awake and alert. °· When you feel hungry, start by eating small amounts of foods that are soft and easy  to digest (bland), such as toast. Gradually return to your regular diet. °· Drink enough fluid to keep your urine pale yellow. °· Return to your normal activities as told by your health care provider. Ask your health care provider what activities are safe for you. °This information is not intended to replace advice given to you by your health care provider. Make sure you discuss any questions you have with your health care provider. °Document Revised: 01/13/2017 Document Reviewed: 08/26/2016 °Elsevier Patient Education © 2020 [employer] ° ° ° ° ° ° °

## 2019-05-22 NOTE — Anesthesia Procedure Notes (Addendum)
Procedure Name: Intubation Date/Time: 05/22/2019 12:02 PM Performed by: West Pugh, CRNA Pre-anesthesia Checklist: Patient identified, Emergency Drugs available, Suction available, Patient being monitored and Timeout performed Patient Re-evaluated:Patient Re-evaluated prior to induction Oxygen Delivery Method: Circle system utilized Preoxygenation: Pre-oxygenation with 100% oxygen Induction Type: IV induction Ventilation: Mask ventilation without difficulty Laryngoscope Size: 3 and Glidescope Grade View: Grade I Tube type: Oral Tube size: 7.5 mm Number of attempts: 2 Airway Equipment and Method: Video-laryngoscopy and Rigid stylet Placement Confirmation: ETT inserted through vocal cords under direct vision,  positive ETCO2,  CO2 detector and breath sounds checked- equal and bilateral Secured at: 22 cm Tube secured with: Tape Dental Injury: Teeth and Oropharynx as per pre-operative assessment  Comments: DL x 1 with grade II-III view with MAC #3. Unable to pass ETT. Bag mask ventilation throughout attempts. Glidescope obtained and Lo Pro 3 and grade I obtained, ETT passed easily and secured at 22cm at lip.

## 2019-05-22 NOTE — Transfer of Care (Signed)
Immediate Anesthesia Transfer of Care Note  Patient: [name].  Procedure(s) Performed: RESTAGE TRANSURETHRAL RESECTION OF BLADDER TUMOR (TURBT) (N/A )  Patient Location: PACU  Anesthesia Type:General  Level of Consciousness: awake, drowsy and patient cooperative  Airway & Oxygen Therapy: Patient Spontanous Breathing and Patient connected to face mask oxygen  Post-op Assessment: Report given to RN and Post -op Vital signs reviewed and stable  Post vital signs: Reviewed and stable  Last Vitals:  Vitals Value Taken Time  BP 138/77 05/22/19 1240  Temp    Pulse 79 05/22/19 1243  Resp 14 05/22/19 1243  SpO2 100 % 05/22/19 1243  Vitals shown include unvalidated device data.  Last Pain:  Vitals:   05/22/19 1119  TempSrc:   PainSc: 0-No pain      Patients Stated Pain Goal: 3 ([id number])  Complications: No apparent anesthesia complications
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
## 2019-05-23 ENCOUNTER — Encounter: Payer: Self-pay | Admitting: *Deleted

## 2019-05-23 LAB — SURGICAL PATHOLOGY

## 2019-05-23 NOTE — Anesthesia Postprocedure Evaluation (Signed)
Anesthesia Post Note  Patient: [name].  Procedure(s) Performed: RESTAGE TRANSURETHRAL RESECTION OF BLADDER TUMOR (TURBT) (N/A )     Patient location during evaluation: PACU Anesthesia Type: General Level of consciousness: awake and alert Pain management: pain level controlled Vital Signs Assessment: post-procedure vital signs reviewed and stable Respiratory status: spontaneous breathing, nonlabored ventilation, respiratory function stable and patient connected to nasal cannula oxygen Cardiovascular status: blood pressure returned to baseline and stable Postop Assessment: no apparent nausea or vomiting Anesthetic complications: no    Last Vitals:  Vitals:   05/22/19 1330 05/22/19 1451  BP: 119/78 139/74  Pulse: 70 69  Resp:  18  Temp:  37.1 C  SpO2: 98% 100%    Last Pain:  Vitals:   05/22/19 1451  TempSrc:   PainSc: 0-No pain                 [name]

## 2019-10-24 ENCOUNTER — Other Ambulatory Visit: Payer: Self-pay | Admitting: Urology

## 2019-10-31 ENCOUNTER — Other Ambulatory Visit: Payer: Self-pay | Admitting: Urology

## 2019-11-06 NOTE — Progress Notes (Addendum)
COVID Vaccine Completed:  X2 Date COVID Vaccine completed:  Feb 2021 COVID vaccine manufacturer: [place]   PCP - Suzanna Obey, MD Cardiologist -   Chest x-ray - 03-12-18 in Epic EKG - 12-26-18 in Epic Stress Test -  ECHO -  Cardiac Cath -  Pacemaker/ICD device last checked:  Sleep Study -  CPAP -   Fasting Blood Sugar -  Checks Blood Sugar _____ times a day  Blood Thinner Instructions: Aspirin Instructions: Last Dose:  Anesthesia review:   Patient denies shortness of breath, fever, cough and chest pain at PAT appointment   Patient verbalized understanding of instructions that were given to them at the PAT appointment. Patient was also instructed that they will need to review over the PAT instructions again at home before surgery.

## 2019-11-06 NOTE — Patient Instructions (Addendum)
DUE TO COVID-19 ONLY ONE VISITOR IS ALLOWED TO COME WITH YOU AND STAY IN THE WAITING ROOM ONLY  DURING PRE OP AND PROCEDURE.   IF YOU WILL BE ADMITTED INTO THE HOSPITAL YOU ARE ALLOWED ONE SUPPORT PERSON DURING VISITATION  HOURS ONLY (10AM -8PM)    The support person may change daily.  The support person must pass our screening, gel in and out, and wear a mask at all times, including in the patients room.  Patients must also wear a mask when staff or their support person are in the room.   COVID SWAB TESTING MUST BE COMPLETED ON:  Saturday, 11-09-19@ 11:00 AM   [address] [place], [place] [address]  (Must self quarantine after testing. Follow instructions on  handout.)   Your procedure is scheduled on:  Wednesday, 11-13-19   Report to [facility] Main  Entrance   Report to admitting at 11:15 AM   Call this number if you have problems the morning of surgery [phone]   Do not eat food :After Midnight.   May have liquids until 10:15 AM  day of surgery  CLEAR LIQUID DIET  Foods Allowed                                                                     Foods Excluded  Water, Black Coffee and tea, regular and decaf              liquids that you cannot  Plain Jell-O in any flavor  (No red)                                     see through such as: Fruit ices (not with fruit pulp)                                      milk, soups, orange juice              Iced Popsicles (No red)                                      All solid food                                   Apple juices Sports drinks like Gatorade (No red) Lightly seasoned clear broth or consume(fat free) Sugar, honey syrup   Oral Hygiene is also important to reduce your risk of infection.                                    Remember - BRUSH YOUR TEETH THE MORNING OF SURGERY WITH YOUR REGULAR TOOTHPASTE   Do NOT smoke after Midnight   Take these medicines the morning of surgery with A SIP OF WATER:   Cetirizine  DO NOT TAKE ANY ORAL DIABETIC MEDICATIONS DAY OF YOUR SURGERY  You may not have any metal on your body including jewelry, and body piercings             Do not wear lotions, powders, perfumes/cologne, or deodorant            Men may shave face and neck.   Do not bring valuables to the hospital. [place].   Contacts, dentures or bridgework may not be worn into surgery.     Patients discharged the day of surgery will not be allowed to drive home.               Please read over the following fact sheets you were given: IF YOU HAVE QUESTIONS ABOUT YOUR PRE OP INSTRUCTIONS PLEASE CALL [phone]    - Preparing for Surgery Before surgery, you can play an important role.  Because skin is not sterile, your skin needs to be as free of germs as possible.  You can reduce the number of germs on your skin by washing with CHG (chlorahexidine gluconate) soap before surgery.  CHG is an antiseptic cleaner which kills germs and bonds with the skin to continue killing germs even after washing. Please DO NOT use if you have an allergy to CHG or antibacterial soaps.  If your skin becomes reddened/irritated stop using the CHG and inform your nurse when you arrive at Short Stay. Do not shave (including legs and underarms) for at least 48 hours prior to the first CHG shower.  You may shave your face/neck.  Please follow these instructions carefully:  1.  Shower with CHG Soap the night before surgery and the  morning of surgery.  2.  If you choose to wash your hair, wash your hair first as usual with your normal  shampoo.  3.  After you shampoo, rinse your hair and body thoroughly to remove the shampoo.                             4.  Use CHG as you would any other liquid soap.  You can apply chg directly to the skin and wash.  Gently with a scrungie or clean washcloth.  5.  Apply the CHG Soap to your body ONLY FROM THE  NECK DOWN.   Do   not use on face/ open                           Wound or open sores. Avoid contact with eyes, ears mouth and   genitals (private parts).                       Wash face,  Genitals (private parts) with your normal soap.             6.  Wash thoroughly, paying special attention to the area where your    surgery  will be performed.  7.  Thoroughly rinse your body with warm water from the neck down.  8.  DO NOT shower/wash with your normal soap after using and rinsing off the CHG Soap.                9.  Pat yourself dry with a clean towel.            10.  Wear clean pajamas.            11.  Place clean sheets on your bed the night of your first shower and do not  sleep with pets. Day of Surgery : Do not apply any lotions/deodorants the morning of surgery.  Please wear clean clothes to the hospital/surgery center.  FAILURE TO FOLLOW THESE INSTRUCTIONS MAY RESULT IN THE CANCELLATION OF YOUR SURGERY  PATIENT SIGNATURE_________________________________  NURSE SIGNATURE__________________________________  ________________________________________________________________________

## 2019-11-07 ENCOUNTER — Encounter (HOSPITAL_COMMUNITY): Payer: Self-pay

## 2019-11-07 ENCOUNTER — Encounter (HOSPITAL_COMMUNITY)
Admission: RE | Admit: 2019-11-07 | Discharge: 2019-11-07 | Disposition: A | Discharge status: Home / Self Care | Payer: Medicare Other | Source: Ambulatory Visit | Attending: Urology | Admitting: Urology

## 2019-11-07 ENCOUNTER — Other Ambulatory Visit: Payer: Self-pay

## 2019-11-07 DIAGNOSIS — E119 Type 2 diabetes mellitus without complications: Secondary | ICD-10-CM | POA: Insufficient documentation

## 2019-11-07 DIAGNOSIS — Z01812 Encounter for preprocedural laboratory examination: Secondary | ICD-10-CM | POA: Diagnosis not present

## 2019-11-07 LAB — CBC
HCT: 46 % (ref 39.0–52.0)
Hemoglobin: 15 g/dL (ref 13.0–17.0)
MCH: 28.6 pg (ref 26.0–34.0)
MCHC: 32.6 g/dL (ref 30.0–36.0)
MCV: 87.8 fL (ref 80.0–100.0)
Platelets: 202 10*3/uL (ref 150–400)
RBC: 5.24 MIL/uL (ref 4.22–5.81)
RDW: 14.1 % (ref 11.5–15.5)
WBC: 8.3 10*3/uL (ref 4.0–10.5)
nRBC: 0 % (ref 0.0–0.2)

## 2019-11-07 LAB — BASIC METABOLIC PANEL
Anion gap: 9 (ref 5–15)
BUN: 25 mg/dL — ABNORMAL HIGH (ref 8–23)
CO2: 24 mmol/L (ref 22–32)
Calcium: 9.7 mg/dL (ref 8.9–10.3)
Chloride: 108 mmol/L (ref 98–111)
Creatinine, Ser: 1.75 mg/dL — ABNORMAL HIGH (ref 0.61–1.24)
GFR, Estimated: 37 mL/min — ABNORMAL LOW (ref >60)
Glucose, Bld: 168 mg/dL — ABNORMAL HIGH (ref 70–99)
Potassium: 4.3 mmol/L (ref 3.5–5.1)
Sodium: 141 mmol/L (ref 135–145)

## 2019-11-07 LAB — GLUCOSE, CAPILLARY: Glucose-Capillary: 163 mg/dL — ABNORMAL HIGH (ref 70–99)

## 2019-11-07 NOTE — Progress Notes (Signed)
BMP sent to Dr. Lovena Neighbours to review.

## 2019-11-08 LAB — HEMOGLOBIN A1C
Hgb A1c MFr Bld: 6.9 % — ABNORMAL HIGH (ref 4.8–5.6)
Mean Plasma Glucose: 151 mg/dL

## 2019-11-09 ENCOUNTER — Other Ambulatory Visit (HOSPITAL_COMMUNITY)
Admission: RE | Admit: 2019-11-09 | Discharge: 2019-11-09 | Disposition: A | Discharge status: Home / Self Care | Payer: Medicare Other | Source: Ambulatory Visit | Attending: Urology | Admitting: Urology

## 2019-11-09 DIAGNOSIS — Z01818 Encounter for other preprocedural examination: Secondary | ICD-10-CM | POA: Insufficient documentation

## 2019-11-09 DIAGNOSIS — Z20822 Contact with and (suspected) exposure to covid-19: Secondary | ICD-10-CM | POA: Insufficient documentation

## 2019-11-09 LAB — SARS CORONAVIRUS 2 (TAT 6-24 HRS): SARS Coronavirus 2: NEGATIVE

## 2019-11-12 NOTE — Progress Notes (Signed)
Spoke to patient and gave him new arrival time of 9:45 and he was advised that he can have clear liquids until 8:45.  Patient stated understanding.

## 2019-11-13 ENCOUNTER — Ambulatory Visit (HOSPITAL_COMMUNITY): Payer: Medicare Other | Admitting: Registered Nurse

## 2019-11-13 ENCOUNTER — Encounter (HOSPITAL_COMMUNITY)
Admission: RE | Disposition: A | Discharge status: Home / Self Care | Payer: Self-pay | Source: Home / Self Care | Attending: Urology

## 2019-11-13 ENCOUNTER — Encounter (HOSPITAL_COMMUNITY): Payer: Self-pay | Admitting: Urology

## 2019-11-13 ENCOUNTER — Ambulatory Visit (HOSPITAL_COMMUNITY)
Admission: RE | Admit: 2019-11-13 | Discharge: 2019-11-13 | Disposition: A | Discharge status: Home / Self Care | Payer: Medicare Other | Attending: Urology | Admitting: Urology

## 2019-11-13 ENCOUNTER — Ambulatory Visit (HOSPITAL_COMMUNITY): Payer: Medicare Other

## 2019-11-13 DIAGNOSIS — N183 Chronic kidney disease, stage 3 unspecified: Secondary | ICD-10-CM | POA: Insufficient documentation

## 2019-11-13 DIAGNOSIS — I129 Hypertensive chronic kidney disease with stage 1 through stage 4 chronic kidney disease, or unspecified chronic kidney disease: Secondary | ICD-10-CM | POA: Diagnosis not present

## 2019-11-13 DIAGNOSIS — Z87891 Personal history of nicotine dependence: Secondary | ICD-10-CM | POA: Diagnosis not present

## 2019-11-13 DIAGNOSIS — C675 Malignant neoplasm of bladder neck: Secondary | ICD-10-CM | POA: Insufficient documentation

## 2019-11-13 DIAGNOSIS — E1122 Type 2 diabetes mellitus with diabetic chronic kidney disease: Secondary | ICD-10-CM | POA: Diagnosis not present

## 2019-11-13 HISTORY — PX: TRANSURETHRAL RESECTION OF BLADDER TUMOR WITH MITOMYCIN-C: SHX6459

## 2019-11-13 HISTORY — PX: CYSTOSCOPY W/ RETROGRADES: SHX1426

## 2019-11-13 LAB — GLUCOSE, CAPILLARY: Glucose-Capillary: 150 mg/dL — ABNORMAL HIGH (ref 70–99)

## 2019-11-13 SURGERY — TRANSURETHRAL RESECTION OF BLADDER TUMOR WITH MITOMYCIN-C
Anesthesia: General | Laterality: Right

## 2019-11-13 MED ORDER — ONDANSETRON HCL 4 MG/2ML IJ SOLN
INTRAMUSCULAR | Status: DC | PRN
  Administered 2019-11-13: 4 mg via INTRAVENOUS

## 2019-11-13 MED ORDER — PHENAZOPYRIDINE HCL 200 MG PO TABS: 200.0000 mg | ORAL_TABLET | Freq: Three times a day (TID) | ORAL | 0 refills | Status: AC | PRN

## 2019-11-13 MED ORDER — SUGAMMADEX SODIUM 200 MG/2ML IV SOLN
INTRAVENOUS | Status: DC | PRN
  Administered 2019-11-13: 200 mg via INTRAVENOUS

## 2019-11-13 MED ORDER — ONDANSETRON HCL 4 MG/2ML IJ SOLN
INTRAMUSCULAR | Status: AC
  Filled 2019-11-13: qty 2

## 2019-11-13 MED ORDER — CHLORHEXIDINE GLUCONATE 0.12 % MT SOLN
15.0000 mL | Freq: Once | OROMUCOSAL | Status: AC
  Administered 2019-11-13: 15 mL via OROMUCOSAL

## 2019-11-13 MED ORDER — FENTANYL CITRATE (PF) 100 MCG/2ML IJ SOLN: 25.0000 ug | INTRAMUSCULAR | Status: DC | PRN

## 2019-11-13 MED ORDER — BELLADONNA ALKALOIDS-OPIUM 16.2-30 MG RE SUPP
RECTAL | Status: AC
  Filled 2019-11-13: qty 1

## 2019-11-13 MED ORDER — CEFAZOLIN SODIUM-DEXTROSE 2-4 GM/100ML-% IV SOLN
2.0000 g | INTRAVENOUS | Status: AC
  Administered 2019-11-13: 2 g via INTRAVENOUS
  Filled 2019-11-13: qty 100

## 2019-11-13 MED ORDER — PROPOFOL 10 MG/ML IV BOLUS
INTRAVENOUS | Status: DC | PRN
  Administered 2019-11-13: 150 mg via INTRAVENOUS
  Administered 2019-11-13: 50 mg via INTRAVENOUS

## 2019-11-13 MED ORDER — LIDOCAINE 2% (20 MG/ML) 5 ML SYRINGE
INTRAMUSCULAR | Status: DC | PRN
  Administered 2019-11-13: 60 mg via INTRAVENOUS

## 2019-11-13 MED ORDER — TRAMADOL HCL 50 MG PO TABS: 50.0000 mg | ORAL_TABLET | Freq: Four times a day (QID) | ORAL | 0 refills | Status: AC | PRN

## 2019-11-13 MED ORDER — LIDOCAINE 2% (20 MG/ML) 5 ML SYRINGE
INTRAMUSCULAR | Status: AC
  Filled 2019-11-13: qty 5

## 2019-11-13 MED ORDER — GEMCITABINE CHEMO FOR BLADDER INSTILLATION 2000 MG
2000.0000 mg | Freq: Once | INTRAVENOUS | Status: AC
  Administered 2019-11-13: 2000 mg via INTRAVESICAL
  Filled 2019-11-13: qty 2000

## 2019-11-13 MED ORDER — ONDANSETRON HCL 4 MG/2ML IJ SOLN: 4.0000 mg | Freq: Once | INTRAMUSCULAR | Status: DC | PRN

## 2019-11-13 MED ORDER — DEXAMETHASONE SODIUM PHOSPHATE 10 MG/ML IJ SOLN
INTRAMUSCULAR | Status: AC
  Filled 2019-11-13: qty 1

## 2019-11-13 MED ORDER — GEMCITABINE CHEMO FOR BLADDER INSTILLATION 2000 MG: 2000.0000 mg | Freq: Once | INTRAVENOUS | Status: DC

## 2019-11-13 MED ORDER — INDIGOTINDISULFONATE SODIUM 8 MG/ML IJ SOLN
INTRAMUSCULAR | Status: DC | PRN
  Administered 2019-11-13: 5 mL via INTRAVENOUS

## 2019-11-13 MED ORDER — ONDANSETRON HCL 4 MG PO TABS: 4.0000 mg | ORAL_TABLET | Freq: Every day | ORAL | 1 refills | Status: AC | PRN

## 2019-11-13 MED ORDER — EPHEDRINE 5 MG/ML INJ
INTRAVENOUS | Status: AC
  Filled 2019-11-13: qty 10

## 2019-11-13 MED ORDER — SULFAMETHOXAZOLE-TRIMETHOPRIM 800-160 MG PO TABS: 1.0000 | ORAL_TABLET | Freq: Two times a day (BID) | ORAL | 0 refills | Status: AC

## 2019-11-13 MED ORDER — PROPOFOL 10 MG/ML IV BOLUS
INTRAVENOUS | Status: AC
  Filled 2019-11-13: qty 20

## 2019-11-13 MED ORDER — ROCURONIUM BROMIDE 10 MG/ML (PF) SYRINGE
PREFILLED_SYRINGE | INTRAVENOUS | Status: DC | PRN
  Administered 2019-11-13: 10 mg via INTRAVENOUS
  Administered 2019-11-13: 40 mg via INTRAVENOUS

## 2019-11-13 MED ORDER — ROCURONIUM BROMIDE 10 MG/ML (PF) SYRINGE
PREFILLED_SYRINGE | INTRAVENOUS | Status: AC
  Filled 2019-11-13: qty 10

## 2019-11-13 MED ORDER — ACETAMINOPHEN 500 MG PO TABS
1000.0000 mg | ORAL_TABLET | Freq: Once | ORAL | Status: AC
  Administered 2019-11-13: 1000 mg via ORAL

## 2019-11-13 MED ORDER — FENTANYL CITRATE (PF) 100 MCG/2ML IJ SOLN
INTRAMUSCULAR | Status: AC
  Filled 2019-11-13: qty 2

## 2019-11-13 MED ORDER — FENTANYL CITRATE (PF) 100 MCG/2ML IJ SOLN
INTRAMUSCULAR | Status: DC | PRN
  Administered 2019-11-13: 25 ug via INTRAVENOUS
  Administered 2019-11-13: 50 ug via INTRAVENOUS

## 2019-11-13 MED ORDER — ORAL CARE MOUTH RINSE: 15.0000 mL | Freq: Once | OROMUCOSAL | Status: AC

## 2019-11-13 MED ORDER — DEXAMETHASONE SODIUM PHOSPHATE 10 MG/ML IJ SOLN
INTRAMUSCULAR | Status: DC | PRN
  Administered 2019-11-13: 8 mg via INTRAVENOUS

## 2019-11-13 MED ORDER — BELLADONNA ALKALOIDS-OPIUM 16.2-60 MG RE SUPP
RECTAL | Status: DC | PRN
  Administered 2019-11-13: 1 via RECTAL

## 2019-11-13 MED ORDER — KETOROLAC TROMETHAMINE 15 MG/ML IJ SOLN: 15.0000 mg | Freq: Once | INTRAMUSCULAR | Status: DC | PRN

## 2019-11-13 MED ORDER — LACTATED RINGERS IV SOLN: INTRAVENOUS | Status: DC

## 2019-11-13 MED ORDER — INDIGOTINDISULFONATE SODIUM 8 MG/ML IJ SOLN
INTRAMUSCULAR | Status: AC
  Filled 2019-11-13: qty 5

## 2019-11-13 MED ORDER — EPHEDRINE SULFATE-NACL 50-0.9 MG/10ML-% IV SOSY
PREFILLED_SYRINGE | INTRAVENOUS | Status: DC | PRN
  Administered 2019-11-13: 5 mg via INTRAVENOUS

## 2019-11-13 MED ORDER — ACETAMINOPHEN 500 MG PO TABS
ORAL_TABLET | ORAL | Status: AC
  Filled 2019-11-13: qty 2

## 2019-11-13 MED ORDER — SODIUM CHLORIDE 0.9 % IR SOLN
Status: DC | PRN
  Administered 2019-11-13 (×2): 6000 mL via INTRAVESICAL

## 2019-11-13 SURGICAL SUPPLY — 23 items
BAG DRN RND TRDRP ANRFLXCHMBR (UROLOGICAL SUPPLIES)
BAG URINE DRAIN 2000ML AR STRL (UROLOGICAL SUPPLIES) IMPLANT
BAG URO CATCHER STRL LF (MISCELLANEOUS) ×4 IMPLANT
CATH FOLEY 2WAY SLVR  5CC 18FR (CATHETERS) ×4
CATH FOLEY 2WAY SLVR 5CC 18FR (CATHETERS) IMPLANT
CATH URET 5FR 28IN OPEN ENDED (CATHETERS) ×2 IMPLANT
ELECT REM PT RETURN 15FT ADLT (MISCELLANEOUS) ×4 IMPLANT
GLOVE BIOGEL M STRL SZ7.5 (GLOVE) ×4 IMPLANT
GOWN STRL REUS W/TWL XL LVL3 (GOWN DISPOSABLE) ×4 IMPLANT
GUIDEWIRE ANG ZIPWIRE 038X150 (WIRE) ×2 IMPLANT
GUIDEWIRE STR DUAL SENSOR (WIRE) ×2 IMPLANT
KIT TURNOVER KIT A (KITS) IMPLANT
LOOP CUT BIPOLAR 24F LRG (ELECTROSURGICAL) IMPLANT
MANIFOLD NEPTUNE II (INSTRUMENTS) ×4 IMPLANT
PACK CYSTO (CUSTOM PROCEDURE TRAY) ×4 IMPLANT
PENCIL SMOKE EVACUATOR (MISCELLANEOUS) IMPLANT
PLUG CATH AND CAP STER (CATHETERS) ×2 IMPLANT
STENT URET 6FRX24 CONTOUR (STENTS) ×2 IMPLANT
SYR TOOMEY IRRIG 70ML (MISCELLANEOUS)
SYRINGE TOOMEY IRRIG 70ML (MISCELLANEOUS) IMPLANT
TUBING CONNECTING 10 (TUBING) ×3 IMPLANT
TUBING CONNECTING 10' (TUBING) ×1
TUBING UROLOGY SET (TUBING) ×4 IMPLANT

## 2019-11-13 NOTE — Op Note (Addendum)
Operative Note  Preoperative diagnosis:  1.  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  Right ureteral orifice obstruction  Procedure(s): 1.  Cystoscopy with TURBT of a total of 4, multifocal bladder tumors (the largest of which measured approximately 2.5 cm) 2.  Right JJ stent placement 3.  Right retrograde pyelogram with intraoperative interpretation of fluoroscopic imaging 4.  Intravesical instillation of gemcitabine  Surgeon: Ellison Hughs, MD  Assistants:  None  Anesthesia:  General  Complications:  None  EBL: 10 mL  Specimens: 1.  Bladder neck tumor (1 cm) 2.  Right lateral wall bladder tumor (2.5 cm) 3.  Bladder dome tumor (1cm) 4.  Left lateral wall bladder tumor (1 cm)  Drains/Catheters: 1.  18 French Foley catheter 2.  Right 6 French, 24 cm JJ stent without tether  Intraoperative findings:   1. Multifocal bladder tumors were seen throughout the bladder mucosa 2.  The right ureteral orifice could not be readily identified upon initial inspection.  I was able to resect an area within a previous scar in the vicinity of where of the expected anatomic location of his right ureteral orifice.  I was then able to identify the right ureteral orifice and place a ureteral stent. 3.  Right retrograde pyelogram revealed uniform dilation of the right ureter extending up to the right renal pelvis.  There were no other filling defects seen within the right renal pelvis or the right ureter.  Indication:  [name]. is a 74 y.o. male, previously followed by Dr. Gloriann Loan, with a history of recurrent high-grade T1 urothelial carcinoma the bladder.  The patient underwent a surveillance cystoscopy with Dr. Gloriann Loan on 10/17/2019 and was found to have recurrent bladder cancer.  He has been consented for the above procedures, voices understanding and wishes to proceed.  Description  of procedure:  After informed consent was obtained, the patient was brought to the operating room and general LMA anesthesia was administered. The patient was then placed in the dorsolithotomy position and prepped and draped in the usual sterile fashion. A timeout was performed. A 23 French rigid cystoscope was then inserted into the urethral meatus and advanced into the bladder under direct vision. A complete bladder survey revealed the findings listed above.  The rigid cystoscope was then exchanged for a 26 French resectoscope with a bipolar loop working element.  I then resected all identifiable bladder tumors down to the detrusor musculature and sent them off as separate specimens for pathologic analysis.  The areas of resection were then extensively fulgurated until hemostasis was achieved.  I was unable to identify the right ureteral orifice and had indigo carmine instilled intravenously.  Despite the indigo carmine, I was unable to identify any reflux from the right ureteral orifice.  I then resected a previous scar in the expected anatomic location of the right ureteral orifice and immediately saw urine efflux.  I then advanced a 5 [place] ureteral catheter into the newly created right ureteral orifice.  A right retrograde pyelogram was obtained, with the findings listed above.  A Glidewire was then advanced through the lumen of the ureteral stent and up to the right renal pelvis, or fluoroscopic guidance.  The ureteral stent was then removed, leaving the wire in place.  A 6 French, 24 cm JJ stent was then advanced over the wire and into good position within the right collecting system, confirming placement via fluoroscopy.  The  cystoscope was then removed and an 61 French Foley catheter was then inserted with return of clear irrigant.  The catheter was then placed to gravity drainage.  While in the recovery room 2000 mg of gemcitabine in 52 mL of water was instilled in the bladder through the  catheter and the catheter was plugged. This will remain indwelling for approximately one hour. It will then be drained from the bladder and the catheter will be removed and the patient discharged home.  Plan:  Follow-up in 2 weeks to discuss his pathology results and possible right ureteral stent removal.

## 2019-11-13 NOTE — Anesthesia Procedure Notes (Signed)
Procedure Name: Intubation Date/Time: 11/13/2019 12:11 PM Performed by: Silas Sacramento, CRNA Pre-anesthesia Checklist: Patient identified, Emergency Drugs available, Suction available and Patient being monitored Patient Re-evaluated:Patient Re-evaluated prior to induction Oxygen Delivery Method: Circle system utilized Preoxygenation: Pre-oxygenation with 100% oxygen Induction Type: IV induction Ventilation: Mask ventilation without difficulty Laryngoscope Size: Mac and 4 Grade View: Grade II Tube type: Oral Tube size: 7.5 mm Number of attempts: 1 Airway Equipment and Method: Stylet and Oral airway Placement Confirmation: ETT inserted through vocal cords under direct vision,  positive ETCO2 and breath sounds checked- equal and bilateral Secured at: 23 cm Tube secured with: Tape Dental Injury: Teeth and Oropharynx as per pre-operative assessment
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
## 2019-11-13 NOTE — Anesthesia Preprocedure Evaluation (Addendum)
Anesthesia Evaluation  Patient identified by MRN, date of birth, ID band Patient awake    Reviewed: Allergy & Precautions, NPO status , Patient's Chart, lab work & pertinent test results  Airway Mallampati: III  TM Distance: >3 FB Neck ROM: Full    Dental  (+) Missing, Poor Dentition   Pulmonary former smoker,    Pulmonary exam normal breath sounds clear to auscultation       Cardiovascular hypertension, Pt. on medications Normal cardiovascular exam Rhythm:Regular Rate:Normal  ECG: NSR, rate 75   Neuro/Psych PSYCHIATRIC DISORDERS History of traumatic head injury  Neuromuscular disease    GI/Hepatic Neg liver ROS, hiatal hernia,   Endo/Other  diabetes, Oral Hypoglycemic Agents  Renal/GU Renal InsufficiencyRenal disease     Musculoskeletal negative musculoskeletal ROS (+)   Abdominal   Peds  Hematology negative hematology ROS (+)   Anesthesia Other Findings BLADDER CANCER  Reproductive/Obstetrics                            Anesthesia Physical Anesthesia Plan  ASA: III  Anesthesia Plan: General   Post-op Pain Management:    Induction: Intravenous  PONV Risk Score and Plan: 3 and Ondansetron, Dexamethasone, Midazolam and Treatment may vary due to age or medical condition  Airway Management Planned: Oral ETT  Additional Equipment:   Intra-op Plan:   Post-operative Plan: Extubation in OR  Informed Consent: I have reviewed the patients History and Physical, chart, labs and discussed the procedure including the risks, benefits and alternatives for the proposed anesthesia with the patient or authorized representative who has indicated his/her understanding and acceptance.     Dental advisory given  Plan Discussed with: CRNA  Anesthesia Plan Comments:        Anesthesia Quick Evaluation

## 2019-11-13 NOTE — Anesthesia Postprocedure Evaluation (Signed)
Anesthesia Post Note  Patient: [name].  Procedure(s) Performed: TRANSURETHRAL RESECTION OF BLADDER TUMOR WITH GEMCITABINE (N/A ) CYSTOSCOPY WITH RETROGRADE PYELOGRAM, INSERTION RIGHT URETERAL STENT (Right )     Patient location during evaluation: PACU Anesthesia Type: General Level of consciousness: awake Pain management: pain level controlled Vital Signs Assessment: post-procedure vital signs reviewed and stable Respiratory status: spontaneous breathing, nonlabored ventilation, respiratory function stable and patient connected to nasal cannula oxygen Cardiovascular status: blood pressure returned to baseline and stable Postop Assessment: no apparent nausea or vomiting Anesthetic complications: no   No complications documented.  Last Vitals:  Vitals:   11/13/19 1500 11/13/19 1511  BP: 136/79 132/86  Pulse: 81 65  Resp: 19   Temp: 36.7 C   SpO2: 91% 98%    Last Pain:  Vitals:   11/13/19 1500  TempSrc:   PainSc: 0-No pain                 [name]

## 2019-11-13 NOTE — Transfer of Care (Signed)
Immediate Anesthesia Transfer of Care Note  Patient: Oshae Simmering.  Procedure(s) Performed: TRANSURETHRAL RESECTION OF BLADDER TUMOR WITH GEMCITABINE (N/A ) CYSTOSCOPY WITH RETROGRADE PYELOGRAM, INSERTION RIGHT URETERAL STENT (Right )  Patient Location: PACU  Anesthesia Type:General  Level of Consciousness: awake, alert , oriented and patient cooperative  Airway & Oxygen Therapy: Patient Spontanous Breathing and Patient connected to face mask oxygen  Post-op Assessment: Report given to RN, Post -op Vital signs reviewed and stable and Patient moving all extremities  Post vital signs: Reviewed and stable  Last Vitals:  Vitals Value Taken Time  BP    Temp    Pulse    Resp    SpO2      Last Pain:  Vitals:   11/13/19 1017  TempSrc: Oral  PainSc: 0-No pain         Complications: No complications documented.

## 2019-11-14 ENCOUNTER — Encounter (HOSPITAL_COMMUNITY): Payer: Self-pay | Admitting: Urology

## 2019-11-14 LAB — SURGICAL PATHOLOGY

## 2021-01-16 IMAGING — CT CT ABD-PELV W/ CM
2 of 5 series · 16 of 46 positions shown, 18 images · IV contrast (omnipaque)
Comparison: None.

CLINICAL DATA: Flank pain, hematuria

EXAM:
CT ABDOMEN AND PELVIS WITH CONTRAST
TECHNIQUE: Multidetector CT imaging of the abdomen and pelvis was performed
using the standard protocol following bolus administration of
intravenous contrast.
CONTRAST:  100mL OMNIPAQUE IOHEXOL 300 MG/ML  SOLN

[Series 2: axial st · axial · 0.89mm/px · z∈[-444,-24]mm · 13 of 97 slices shown, 15 images]
[im 7/97  soft-tissue]
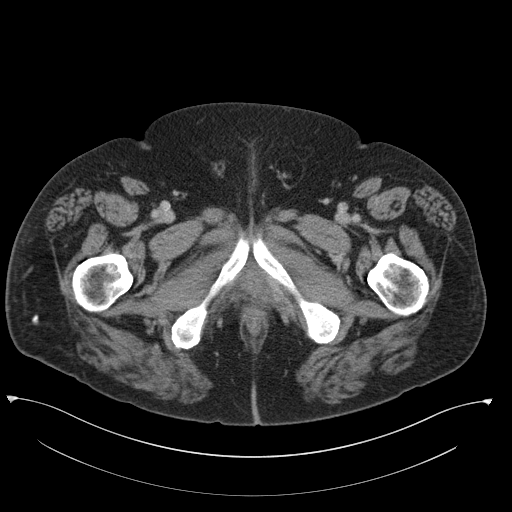
[im 7/97  bone]
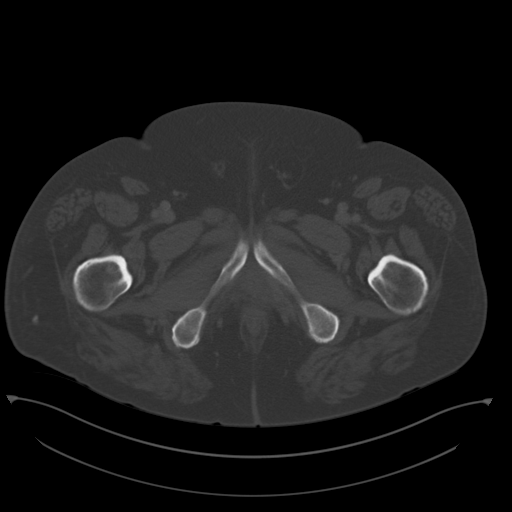
[im 13/97  soft-tissue]
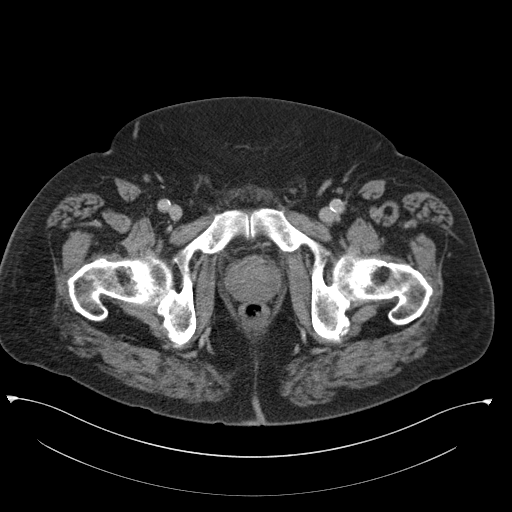
[im 19/97  soft-tissue]
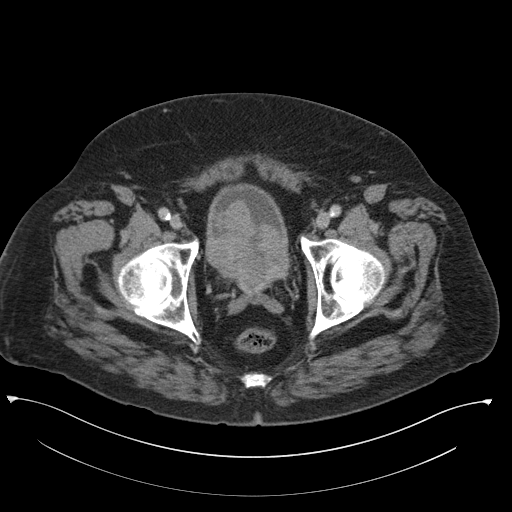
[im 31/97  soft-tissue]
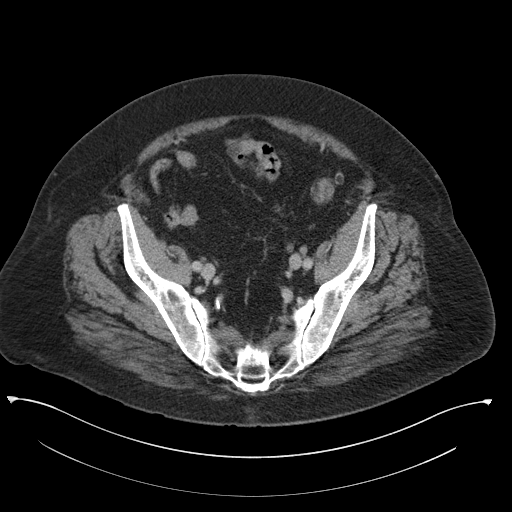
[im 37/97  soft-tissue]
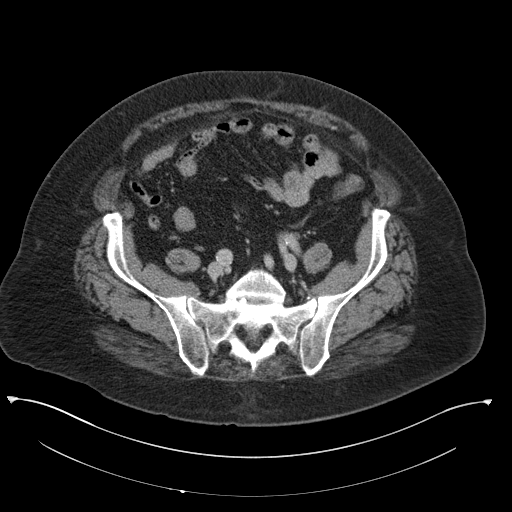
[im 43/97  soft-tissue]
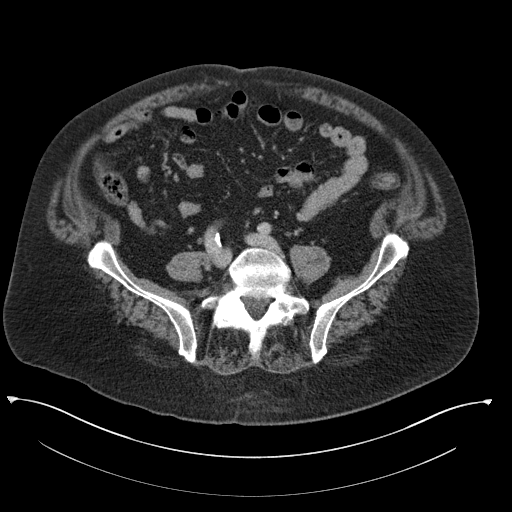
[im 49/97  soft-tissue]
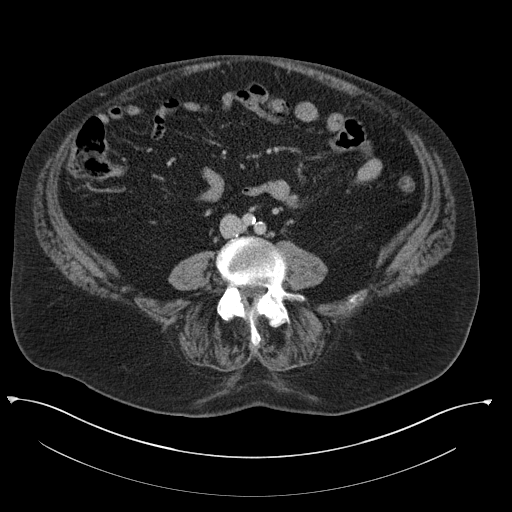
[im 55/97  soft-tissue]
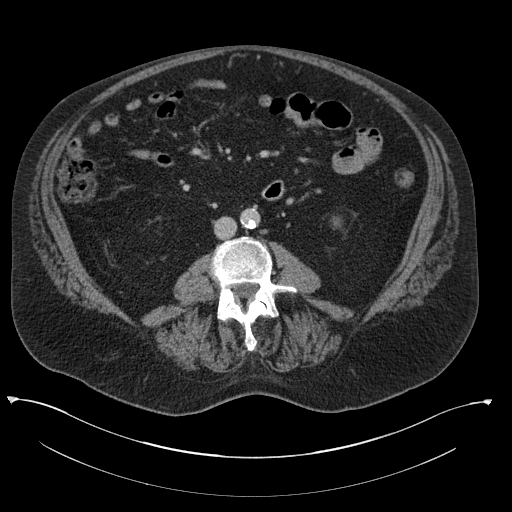
[im 61/97  soft-tissue]
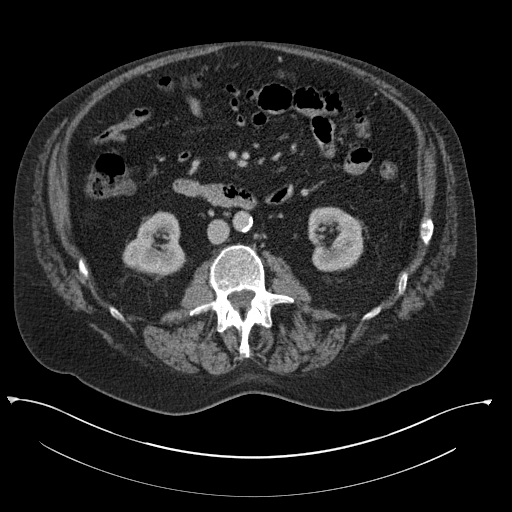
[im 61/97  bone]
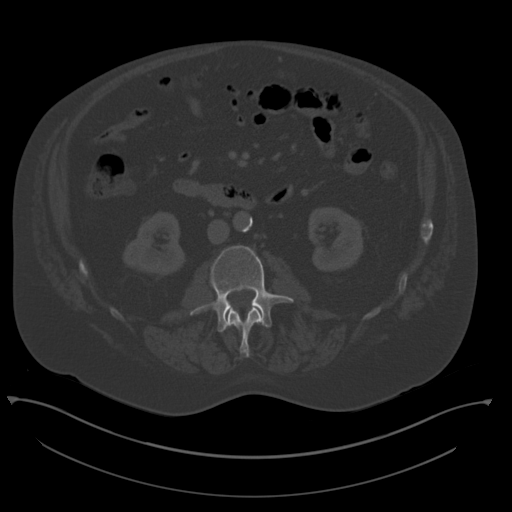
[im 67/97  soft-tissue]
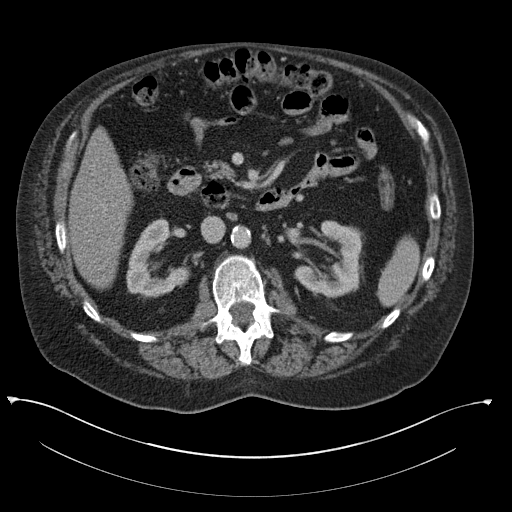
[im 79/97  soft-tissue]
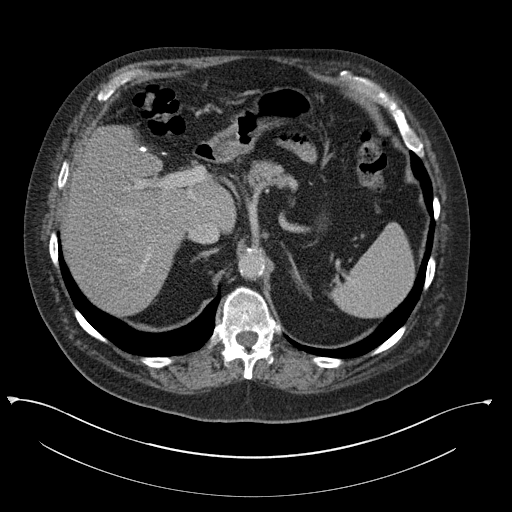
[im 85/97  soft-tissue]
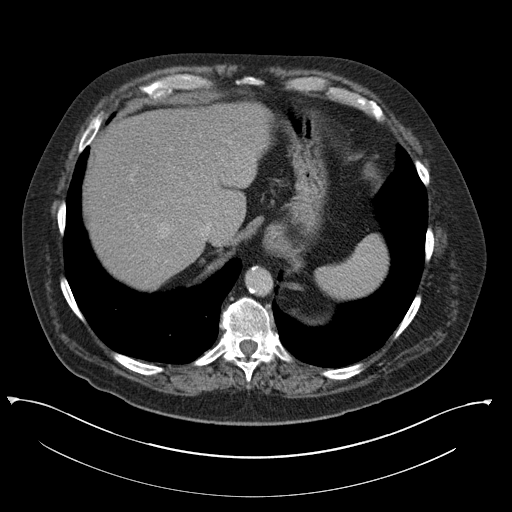
[im 91/97  soft-tissue]
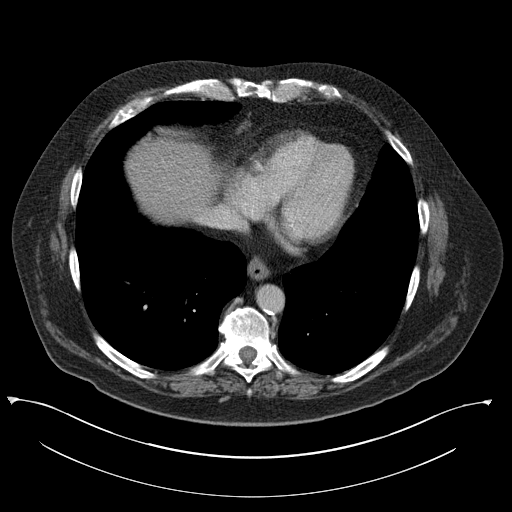

[Series 5: coronal st · coronal · 0.92mm/px · 3 of 164 slices shown]
[im 55/164  soft-tissue]
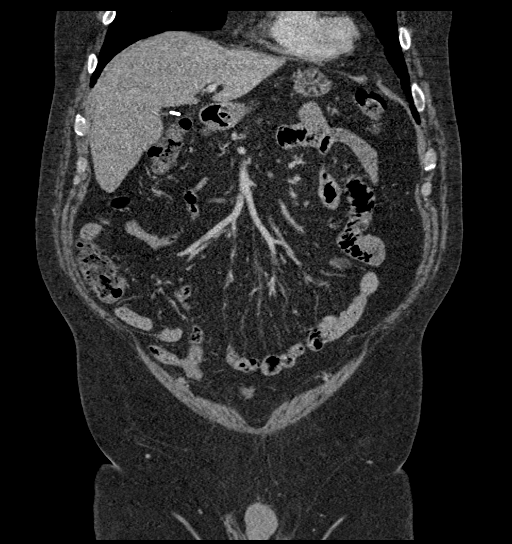
[im 73/164  soft-tissue]
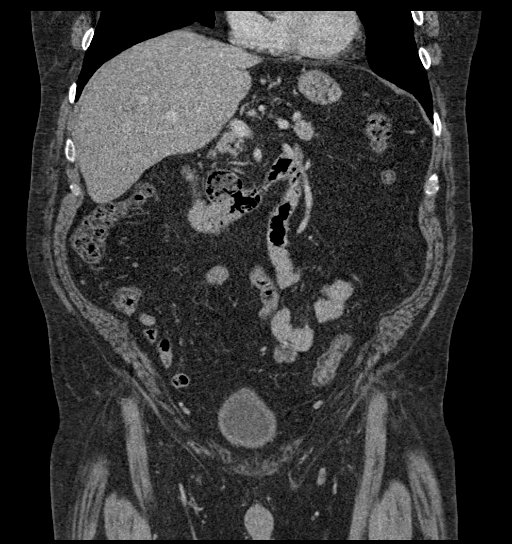
[im 91/164  soft-tissue]
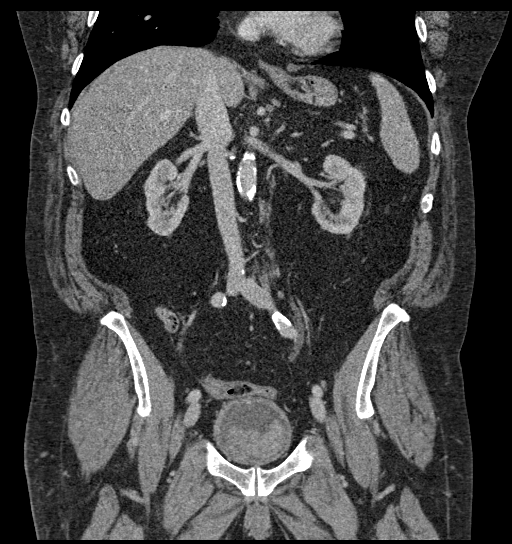

[16 of 46 positions shown; findings below may reference images not displayed]

FINDINGS: Lower chest: The visualized heart size within normal limits. No
pericardial fluid/thickening.

There is a small hiatal hernia.

The visualized portions of the lungs are clear.

Hepatobiliary: The liver is normal in density without focal
abnormality.The main portal vein is patent. The patient is status
post cholecystectomy. No biliary ductal dilation.

Pancreas: Unremarkable. No pancreatic ductal dilatation or
surrounding inflammatory changes.

Spleen: Normal in size without focal abnormality.

Adrenals/Urinary Tract: Both adrenal glands appear normal. Mild
bilateral renal atrophy is seen. There is left renal vascular
calcification seen adjacent to the midpole. There are dense vascular
calcifications seen at the origin of the bilateral renal arteries.
There is a large amount of heterogeneous blood products seen within
the bladder with heterogeneous soft tissue mass/thickening along the
posterior bladder wall. There is diffuse bladder wall thickening
seen around the remainder of the bladder.

Stomach/Bowel: The stomach, small bowel, and colon are normal in
appearance. No inflammatory changes, wall thickening, or obstructive
findings.The appendix is normal.

Vascular/Lymphatic: There are no enlarged mesenteric,
retroperitoneal, or pelvic lymph nodes. Scattered aortic
atherosclerotic calcifications are seen without aneurysmal
dilatation.

Reproductive: The prostate is heterogeneous and abuts the posterior
surface of the bladder.

Other: No evidence of abdominal wall mass or hernia.

Musculoskeletal: No acute or significant osseous findings.
IMPRESSION: Layering blood products with a diffuse heterogeneous soft tissue
mass in the posterior bladder with diffuse bladder wall thickening.

Aortic Atherosclerosis ([id number].O).

These results were called by telephone at the time of interpretation
on 11/20/2018 at [DATE] to provider RUDI JUMPER , who verbally
acknowledged these results.
